# Patient Record
Sex: Female | Born: 1970 | Race: Black or African American | Hispanic: No | Marital: Single | Smoking: Current every day smoker
Health system: Southern US, Community
[De-identification: ages and names within clinical notes are randomized; demographics above are authoritative.]

## PROBLEM LIST (undated history)

## (undated) DIAGNOSIS — F319 Bipolar disorder, unspecified: Secondary | ICD-10-CM

## (undated) DIAGNOSIS — I10 Essential (primary) hypertension: Secondary | ICD-10-CM

## (undated) HISTORY — PX: OTHER SURGICAL HISTORY: SHX169

## (undated) HISTORY — PX: TUBAL LIGATION: SHX77

---

## 1993-10-30 HISTORY — PX: TUBAL LIGATION: SHX77

## 2011-02-20 ENCOUNTER — Inpatient Hospital Stay (INDEPENDENT_AMBULATORY_CARE_PROVIDER_SITE_OTHER)
Admission: RE | Admit: 2011-02-20 | Discharge: 2011-02-20 | Disposition: A | Discharge status: Home / Self Care | Payer: Self-pay | Source: Ambulatory Visit | Attending: Family Medicine | Admitting: Family Medicine

## 2011-02-20 DIAGNOSIS — J019 Acute sinusitis, unspecified: Secondary | ICD-10-CM

## 2011-02-20 DIAGNOSIS — I10 Essential (primary) hypertension: Secondary | ICD-10-CM

## 2011-09-01 ENCOUNTER — Inpatient Hospital Stay (INDEPENDENT_AMBULATORY_CARE_PROVIDER_SITE_OTHER)
Admission: RE | Admit: 2011-09-01 | Discharge: 2011-09-01 | Disposition: A | Discharge status: Home / Self Care | Payer: Self-pay | Source: Ambulatory Visit | Attending: Family Medicine | Admitting: Family Medicine

## 2011-09-01 DIAGNOSIS — I10 Essential (primary) hypertension: Secondary | ICD-10-CM

## 2011-09-01 DIAGNOSIS — N949 Unspecified condition associated with female genital organs and menstrual cycle: Secondary | ICD-10-CM

## 2011-09-01 LAB — POCT URINALYSIS DIP (DEVICE)
Glucose, UA: NEGATIVE mg/dL
Ketones, ur: 15 mg/dL — AB
Protein, ur: NEGATIVE mg/dL
Specific Gravity, Urine: 1.02 (ref 1.005–1.030)
Urobilinogen, UA: 1 mg/dL (ref 0.0–1.0)

## 2011-09-01 LAB — WET PREP, GENITAL
WBC, Wet Prep HPF POC: NONE SEEN
Yeast Wet Prep HPF POC: NONE SEEN

## 2011-09-01 LAB — POCT PREGNANCY, URINE: Preg Test, Ur: NEGATIVE

## 2011-09-02 LAB — GC/CHLAMYDIA PROBE AMP, GENITAL: GC Probe Amp, Genital: NEGATIVE

## 2011-09-07 NOTE — ED Notes (Signed)
Lab and chart reviewed. Pt. adequately treated with Flagyl for bacterial vaginosis. Barbara Ellis

## 2012-02-20 ENCOUNTER — Encounter (HOSPITAL_COMMUNITY): Payer: Self-pay

## 2012-02-20 ENCOUNTER — Emergency Department (INDEPENDENT_AMBULATORY_CARE_PROVIDER_SITE_OTHER)
Admission: EM | Admit: 2012-02-20 | Discharge: 2012-02-20 | Disposition: A | Discharge status: Home / Self Care | Payer: Self-pay | Source: Home / Self Care | Attending: Emergency Medicine | Admitting: Emergency Medicine

## 2012-02-20 DIAGNOSIS — E860 Dehydration: Secondary | ICD-10-CM

## 2012-02-20 DIAGNOSIS — H9209 Otalgia, unspecified ear: Secondary | ICD-10-CM

## 2012-02-20 DIAGNOSIS — R42 Dizziness and giddiness: Secondary | ICD-10-CM

## 2012-02-20 DIAGNOSIS — D179 Benign lipomatous neoplasm, unspecified: Secondary | ICD-10-CM

## 2012-02-20 DIAGNOSIS — W5911XA Bitten by nonvenomous snake, initial encounter: Secondary | ICD-10-CM

## 2012-02-20 DIAGNOSIS — W5901XA Bitten by nonvenomous lizards, initial encounter: Secondary | ICD-10-CM

## 2012-02-20 HISTORY — DX: Essential (primary) hypertension: I10

## 2012-02-20 LAB — POCT I-STAT, CHEM 8
Creatinine, Ser: 0.9 mg/dL (ref 0.50–1.10)
Glucose, Bld: 97 mg/dL (ref 70–99)
HCT: 49 % — ABNORMAL HIGH (ref 36.0–46.0)
Hemoglobin: 16.7 g/dL — ABNORMAL HIGH (ref 12.0–15.0)
Potassium: 3.9 mEq/L (ref 3.5–5.1)
TCO2: 24 mmol/L (ref 0–100)

## 2012-02-20 MED ORDER — MECLIZINE HCL 12.5 MG PO TABS: 25.0000 mg | ORAL_TABLET | Freq: Three times a day (TID) | ORAL | Status: AC | PRN

## 2012-02-20 NOTE — ED Notes (Signed)
C/o intermittent dizziness for 3 weeks, also reports area to rt abdomen that burns and "knot" to rt shoulder for 3 weeks. States all the sx started 3 weeks ago after snake bite to lt hand- concerned sx are related to snake bite.  No marks, breaks in skin, or discoloration noted to lt hand at site of bite. Significant other reports syncopal episode approx. 2 1/2 weeks ago.

## 2012-02-20 NOTE — Discharge Instructions (Signed)
  Today you had multiple and several concerns. #1 dizziness #2 lipoma #3 non-venomous  We discuss your exam and lab results and measures to take the next 48 hours for oral rehydration. Should also establish with primary care Dr.     Dehydration, Adult Dehydration means your body does not have as much fluid as it needs. Your kidneys, brain, and heart will not work properly without the right amount of fluids and salt.  HOME CARE  Ask your doctor how to replace body fluid losses (rehydrate).   Drink enough fluids to keep your pee (urine) clear or pale yellow.   Drink small amounts of fluids often if you feel sick to your stomach (nauseous) or throw up (vomit).   Eat like you normally do.   Avoid:   Foods or drinks high in sugar.   Bubbly (carbonated) drinks.   Juice.   Very hot or cold fluids.   Drinks with caffeine.   Fatty, greasy foods.   Alcohol.   Tobacco.   Eating too much.   Gelatin desserts.   Wash your hands to avoid spreading germs (bacteria, viruses).   Only take medicine as told by your doctor.   Keep all doctor visits as told.  GET HELP RIGHT AWAY IF:   You cannot drink something without throwing up.   You get worse even with treatment.   Your vomit has blood in it or looks greenish.   Your poop (stool) has blood in it or looks black and tarry.   You have not peed in 6 to 8 hours.   You pee a small amount of very dark pee.   You have a fever.   You pass out (faint).   You have belly (abdominal) pain that gets worse or stays in one spot (localizes).   You have a rash, stiff neck, or bad headache.   You get easily annoyed, sleepy, or are hard to wake up.   You feel weak, dizzy, or very thirsty.  MAKE SURE YOU:   Understand these instructions.   Will watch your condition.   Will get help right away if you are not doing well or get worse.  Document Released: 08/12/2009 Document Revised: 10/05/2011 Document Reviewed:  06/05/2011 [facility] Patient Information 2012 [place], [place].

## 2012-02-20 NOTE — ED Provider Notes (Signed)
History     CSN: [id number]  Arrival date & time 02/20/12  0845   First MD Initiated Contact with Patient 02/20/12 [phone]      Chief Complaint  Patient presents with  . Dizziness  . Abdominal Pain  . Shoulder Problem    (Consider location/radiation/quality/duration/timing/severity/associated sxs/prior treatment) HPI Comments: Patient presents with intermittent dizziness for the last 3 weeks she describes about 3 weeks ago she was bitten in her left hand by a snake in her hand became swollen at that time she received no medical attention at that point. (At that point denies having had any abdominal pain, numbness tingling,. Also describes about 2 weeks ago she had a episode which he thinks he passed out, this episode was unwitnessed so she is unable to recall any specific events around this event. Denies that she was experiencing any chest pains or palpitations or shortness of breath.  She has also been noticing for several months a big not on her right shoulder it is just increasing in size and occasionally feels like a numbing sensation in this area. Also describes that her abdominal area mainly in the right side sometimes feels a knot that is much smaller but similar to the one she has in her shoulder.  Today she describes it she feels somewhat often dizzy he gets worse when she walks around and had been feeling in perceiving popping noises coming out of her left ear. Patient denies any congestion, runny nose, sinus pressure or respiratory symptoms. No fevers. Other significant negative symptoms were no abdominal pain at rest, no nausea or vomiting, no urinary symptoms.  Patient is a 41 y.o. female presenting with abdominal pain. The history is provided by the patient.  Abdominal Pain The primary symptoms of the illness include abdominal pain. The primary symptoms of the illness do not include fever, fatigue, shortness of breath, nausea, vomiting, diarrhea, hematemesis, dysuria or vaginal  discharge. The onset of the illness was gradual. The problem has not changed since onset. The patient states that she believes she is currently not pregnant. The patient has not had a change in bowel habit. Symptoms associated with the illness do not include anorexia.    Past Medical History  Diagnosis Date  . Hypertension     Past Surgical History  Procedure Date  . C section   . Tubal ligation     No family history on file.  History  Substance Use Topics  . Smoking status: Current Everyday Smoker  . Smokeless tobacco: Not on file  . Alcohol Use: Yes    OB History    Grav Para Term Preterm Abortions TAB SAB Ect Mult Living                  Review of Systems  Constitutional: Negative for fever, activity change, appetite change and fatigue.  HENT: Positive for ear pain. Negative for facial swelling, rhinorrhea, neck pain, neck stiffness and tinnitus.   Respiratory: Negative for cough and shortness of breath.   Cardiovascular: Negative for chest pain, palpitations and leg swelling.  Gastrointestinal: Positive for abdominal pain. Negative for nausea, vomiting, diarrhea, anorexia and hematemesis.  Genitourinary: Negative for dysuria and vaginal discharge.  Skin: Negative for color change, rash and wound.  Neurological: Positive for dizziness and light-headedness. Negative for tremors, seizures, syncope, speech difficulty, weakness, numbness and headaches.    Allergies  Sulfa antibiotics  Home Medications   Current Outpatient Rx  Name Route Sig Dispense Refill  . MECLIZINE HCL  12.5 MG PO TABS Oral Take 2 tablets (25 mg total) by mouth 3 (three) times daily as needed for nausea. 30 tablet 0    BP 142/85  Pulse 74  Temp(Src) 98.4 F (36.9 C) (Oral)  Resp 16  SpO2 98%  LMP 02/13/2012  Physical Exam  Nursing note and vitals reviewed. Constitutional: She is oriented to person, place, and time. She appears well-developed and well-nourished. No distress.  HENT:    Right Ear: Tympanic membrane normal.  Left Ear: Tympanic membrane and ear canal normal.  Mouth/Throat: No oropharyngeal exudate.  Eyes: Conjunctivae are normal.  Neck: Neck supple.  Cardiovascular: Normal rate and regular rhythm.  Exam reveals no gallop and no friction rub.   No murmur heard. Pulmonary/Chest: Effort normal and breath sounds normal.  Neurological: She is alert and oriented to person, place, and time. No cranial nerve deficit.  Skin: No rash noted. No erythema. No pallor.       ED Course  Procedures (including critical care time)  Labs Reviewed  POCT I-STAT, CHEM 8 - Abnormal; Notable for the following:    Hemoglobin 16.7 (*)    HCT 49.0 (*)    All other components within normal limits   No results found.   1. Dehydration   2. Dizziness   3. Otalgia   4. Bite, snake, non-venomous   5. Lipoma       MDM   Patient multisymptom visit, not limited to unknown (type) snakebite 3 weeks ago, intermittent dizziness, right shoulder lipoma, intermittent right-sided abdominal discomfort. Presyncopal and syncopal episodes. Patient has been expressing some of his symptoms for weeks did not receive any medical attention. Today patient describes she's still dizzy and having intermittent left ear discomforts. Vital signs repeated, physical exam and labs were as discussed normal. Patient was instructed for several things among them breath relaxer symptoms that would want further evaluation in the emergency department. Patient understood instructions and agreed with plan of care and followup care as necessary. Waiting for Medicaid to activate to establish a primary care Dr. She suspect she has high blood pressure. Have encouraged patient to rehydrate herself she had mild elevation of her H&H. As patient was having intermittent left-sided ear discomfort along with some vertigo sensations was instructed to avoid driving and to take meclizine for the next 72 hours.       Jimmie [name], MD 02/20/12 1140

## 2012-07-13 ENCOUNTER — Encounter (HOSPITAL_COMMUNITY): Payer: Self-pay | Admitting: Emergency Medicine

## 2012-07-13 ENCOUNTER — Emergency Department (HOSPITAL_COMMUNITY)
Admission: EM | Admit: 2012-07-13 | Discharge: 2012-07-14 | Disposition: A | Discharge status: Home / Self Care | Payer: Self-pay | Attending: Emergency Medicine | Admitting: Emergency Medicine

## 2012-07-13 DIAGNOSIS — I10 Essential (primary) hypertension: Secondary | ICD-10-CM | POA: Insufficient documentation

## 2012-07-13 DIAGNOSIS — F319 Bipolar disorder, unspecified: Secondary | ICD-10-CM | POA: Insufficient documentation

## 2012-07-13 DIAGNOSIS — F32A Depression, unspecified: Secondary | ICD-10-CM

## 2012-07-13 DIAGNOSIS — F17200 Nicotine dependence, unspecified, uncomplicated: Secondary | ICD-10-CM | POA: Insufficient documentation

## 2012-07-13 DIAGNOSIS — F329 Major depressive disorder, single episode, unspecified: Secondary | ICD-10-CM

## 2012-07-13 HISTORY — DX: Bipolar disorder, unspecified: F31.9

## 2012-07-13 NOTE — ED Notes (Signed)
Pt placed in scrubs. 

## 2012-07-13 NOTE — ED Notes (Signed)
Pt sent from [facility] for med clearance, can return if clear. Pt +SI, +HI, pt is IVC

## 2012-07-14 LAB — COMPREHENSIVE METABOLIC PANEL
AST: 12 U/L (ref 0–37)
Albumin: 3.8 g/dL (ref 3.5–5.2)
Alkaline Phosphatase: 53 U/L (ref 39–117)
BUN: 9 mg/dL (ref 6–23)
Chloride: 103 mEq/L (ref 96–112)
Potassium: 3 mEq/L — ABNORMAL LOW (ref 3.5–5.1)
Sodium: 137 mEq/L (ref 135–145)
Total Bilirubin: 0.8 mg/dL (ref 0.3–1.2)
Total Protein: 6.7 g/dL (ref 6.0–8.3)

## 2012-07-14 LAB — RAPID URINE DRUG SCREEN, HOSP PERFORMED
Amphetamines: NOT DETECTED
Barbiturates: NOT DETECTED
Opiates: NOT DETECTED
Tetrahydrocannabinol: POSITIVE — AB

## 2012-07-14 LAB — CBC WITH DIFFERENTIAL/PLATELET
Basophils Absolute: 0 10*3/uL (ref 0.0–0.1)
Basophils Relative: 0 % (ref 0–1)
Eosinophils Absolute: 0.3 10*3/uL (ref 0.0–0.7)
Hemoglobin: 15.4 g/dL — ABNORMAL HIGH (ref 12.0–15.0)
MCH: 33 pg (ref 26.0–34.0)
MCHC: 35.5 g/dL (ref 30.0–36.0)
Neutro Abs: 7.1 10*3/uL (ref 1.7–7.7)
Neutrophils Relative %: 60 % (ref 43–77)
Platelets: 285 10*3/uL (ref 150–400)
RDW: 13 % (ref 11.5–15.5)

## 2012-07-14 MED ORDER — IBUPROFEN 200 MG PO TABS
600.0000 mg | ORAL_TABLET | Freq: Once | ORAL | Status: AC
  Administered 2012-07-14: 600 mg via ORAL
  Filled 2012-07-14: qty 3

## 2012-07-14 MED ORDER — POTASSIUM CHLORIDE 20 MEQ/15ML (10%) PO LIQD
40.0000 meq | Freq: Once | ORAL | Status: AC
  Administered 2012-07-14: 40 meq via ORAL
  Filled 2012-07-14: qty 30

## 2012-07-14 MED ORDER — LISINOPRIL 5 MG PO TABS
5.0000 mg | ORAL_TABLET | Freq: Once | ORAL | Status: AC
  Administered 2012-07-14: 5 mg via ORAL
  Filled 2012-07-14: qty 1

## 2012-07-14 MED ORDER — LISINOPRIL 20 MG PO TABS: 5.0000 mg | ORAL_TABLET | Freq: Every day | ORAL | Status: DC

## 2012-07-14 NOTE — ED Provider Notes (Signed)
History     CSN: [id number]  Arrival date & time 07/13/12  2313   First MD Initiated Contact with Patient 07/14/12 0041      Chief Complaint  Patient presents with  . Medical Clearance    (Consider location/radiation/quality/duration/timing/severity/associated sxs/prior treatment) The history is provided by the patient. No language interpreter was used.  41yo female coming from [facility] for medical clearance and for high blood pressure.  She has been at the facility x 2 days for depression and SI.  No c/o h/a, weakness, or any stroke symptoms.  We will medically clear her and send her back.  Officer at bedside.  Past Medical History  Diagnosis Date  . Hypertension   . Bipolar 1 disorder     Past Surgical History  Procedure Date  . C section   . Tubal ligation     No family history on file.  History  Substance Use Topics  . Smoking status: Current Every Day Smoker  . Smokeless tobacco: Not on file  . Alcohol Use: No    OB History    Grav Para Term Preterm Abortions TAB SAB Ect Mult Living                  Review of Systems  Constitutional: Negative.   HENT: Negative.   Eyes: Negative.   Respiratory: Negative.   Cardiovascular: Negative.   Gastrointestinal: Negative.   Musculoskeletal: Negative.   Skin: Negative.   Neurological: Negative.   Psychiatric/Behavioral: Positive for suicidal ideas. Negative for hallucinations, confusion, disturbed wake/sleep cycle and dysphoric mood. The patient is not nervous/anxious.   All other systems reviewed and are negative.    Allergies  Sulfa antibiotics  Home Medications   Current Outpatient Rx  Name Route Sig Dispense Refill  . ARIPIPRAZOLE 5 MG PO TABS Oral Take 2.5 mg by mouth daily.    . TRAZODONE HCL 50 MG PO TABS Oral Take 50 mg by mouth at bedtime.      BP 164/99  Pulse 84  Temp 98 F (36.7 C) (Oral)  Resp 16  Wt 210 lb (95.255 kg)  SpO2 100%  LMP 07/12/2012  Physical Exam  Nursing note and  vitals reviewed. Constitutional: She is oriented to person, place, and time. She appears well-developed and well-nourished.  HENT:  Head: Normocephalic and atraumatic.  Eyes: Conjunctivae normal and EOM are normal. Pupils are equal, round, and reactive to light.  Neck: Normal range of motion. Neck supple.  Cardiovascular: Normal rate.   Pulmonary/Chest: Effort normal.  Abdominal: Soft.  Musculoskeletal: Normal range of motion. She exhibits no edema and no tenderness.  Neurological: She is alert and oriented to person, place, and time. She has normal reflexes.  Skin: Skin is warm and dry.  Psychiatric: Her speech is normal and behavior is normal. Judgment and thought content normal. Her mood appears not anxious. Her affect is not inappropriate. Cognition and memory are normal. She exhibits a depressed mood.       Pleasant and cooperative.  States she had SI for the last 2 days but better today.    ED Course  Procedures (including critical care time)  Labs Reviewed  CBC WITH DIFFERENTIAL - Abnormal; Notable for the following:    WBC 11.9 (*)     Hemoglobin 15.4 (*)     All other components within normal limits  URINE RAPID DRUG SCREEN (HOSP PERFORMED) - Abnormal; Notable for the following:    Tetrahydrocannabinol POSITIVE (*)     All  other components within normal limits  COMPREHENSIVE METABOLIC PANEL  ETHANOL   No results found.   1. Depression       MDM  41yo female here from [facility]  medically cleared and rx for lisinopril. SI and depression. Return to [facility] with Emergency planning/management officer.  Ready for discharge. Rx for lisinopril.   Kcl for potassium 3.0.    Labs Reviewed  CBC WITH DIFFERENTIAL - Abnormal; Notable for the following:    WBC 11.9 (*)     Hemoglobin 15.4 (*)     All other components within normal limits  COMPREHENSIVE METABOLIC PANEL - Abnormal; Notable for the following:    Potassium 3.0 (*)     All other components within normal limits  URINE RAPID DRUG  SCREEN (HOSP PERFORMED) - Abnormal; Notable for the following:    Tetrahydrocannabinol POSITIVE (*)     All other components within normal limits  ETHANOL  LAB REPORT - SCANNED           Remi Haggard, NP 07/15/12 1653

## 2012-07-14 NOTE — ED Notes (Signed)
Results faxed to [facility] 

## 2012-07-16 NOTE — ED Provider Notes (Signed)
Medical screening examination/treatment/procedure(s) were performed by non-physician practitioner and as supervising physician I was immediately available for consultation/collaboration.   Akai Dollard M Nesreen Albano, MD 07/16/12 0747 

## 2013-11-21 ENCOUNTER — Other Ambulatory Visit (HOSPITAL_COMMUNITY)
Admission: RE | Admit: 2013-11-21 | Discharge: 2013-11-21 | Disposition: A | Discharge status: Home / Self Care | Payer: BC Managed Care – PPO | Source: Ambulatory Visit | Attending: Family Medicine | Admitting: Family Medicine

## 2013-11-21 ENCOUNTER — Emergency Department (HOSPITAL_COMMUNITY)
Admission: EM | Admit: 2013-11-21 | Discharge: 2013-11-21 | Disposition: A | Discharge status: Home / Self Care | Payer: BC Managed Care – PPO | Source: Home / Self Care

## 2013-11-21 ENCOUNTER — Encounter (HOSPITAL_COMMUNITY): Payer: Self-pay | Admitting: Emergency Medicine

## 2013-11-21 DIAGNOSIS — N760 Acute vaginitis: Secondary | ICD-10-CM | POA: Insufficient documentation

## 2013-11-21 DIAGNOSIS — A084 Viral intestinal infection, unspecified: Secondary | ICD-10-CM

## 2013-11-21 DIAGNOSIS — Z113 Encounter for screening for infections with a predominantly sexual mode of transmission: Secondary | ICD-10-CM | POA: Insufficient documentation

## 2013-11-21 DIAGNOSIS — R109 Unspecified abdominal pain: Secondary | ICD-10-CM

## 2013-11-21 DIAGNOSIS — N898 Other specified noninflammatory disorders of vagina: Secondary | ICD-10-CM

## 2013-11-21 DIAGNOSIS — N72 Inflammatory disease of cervix uteri: Secondary | ICD-10-CM

## 2013-11-21 DIAGNOSIS — K219 Gastro-esophageal reflux disease without esophagitis: Secondary | ICD-10-CM

## 2013-11-21 DIAGNOSIS — A088 Other specified intestinal infections: Secondary | ICD-10-CM

## 2013-11-21 LAB — POCT URINALYSIS DIP (DEVICE)
Bilirubin Urine: NEGATIVE
Glucose, UA: NEGATIVE mg/dL
KETONES UR: NEGATIVE mg/dL
Leukocytes, UA: NEGATIVE
Nitrite: NEGATIVE
PROTEIN: NEGATIVE mg/dL
SPECIFIC GRAVITY, URINE: 1.02 (ref 1.005–1.030)
Urobilinogen, UA: 0.2 mg/dL (ref 0.0–1.0)
pH: 5.5 (ref 5.0–8.0)

## 2013-11-21 LAB — POCT PREGNANCY, URINE: PREG TEST UR: NEGATIVE

## 2013-11-21 MED ORDER — OMEPRAZOLE 20 MG PO CPDR: 20.0000 mg | DELAYED_RELEASE_CAPSULE | Freq: Two times a day (BID) | ORAL | Status: DC

## 2013-11-21 MED ORDER — METRONIDAZOLE 500 MG PO TABS: 500.0000 mg | ORAL_TABLET | Freq: Two times a day (BID) | ORAL | Status: DC

## 2013-11-21 MED ORDER — HYOSCYAMINE SULFATE 0.125 MG SL SUBL: 0.1250 mg | SUBLINGUAL_TABLET | SUBLINGUAL | Status: DC | PRN

## 2013-11-21 MED ORDER — ONDANSETRON HCL 4 MG PO TABS: 4.0000 mg | ORAL_TABLET | Freq: Four times a day (QID) | ORAL | Status: DC

## 2013-11-21 NOTE — ED Notes (Signed)
C/o 2 week duration of abdominal pain, worsening past 2-3 days. Concern for poss UTI, thicker than usual vaginal secretions LMP onset last PM, 2 weeks early , no BC. Pain lower abdomin w radiation from RLQ onto back

## 2013-11-21 NOTE — ED Provider Notes (Signed)
CSN: [id number]     Arrival date & time 11/21/13  1200 History   First MD Initiated Contact with Patient 11/21/13 1233     Chief Complaint  Patient presents with  . Abdominal Pain   (Consider location/radiation/quality/duration/timing/severity/associated sxs/prior Treatment) HPI Comments: 43-year-old female complains that 2-3 weeks of lower abdominal pain and cramping which was worse at onset, and improved for a few days and then worse in the past 3 days with nausea and dizziness. She also has frequent urination. States she has a thick or vaginal discharge has some sort of discharge most of the time. She has vomiting and diarrhea 2 weeks ago and now nausea without vomiting. She has diarrhea 3 and 4 days ago but none in the past 2 days. No fever.   Past Medical History  Diagnosis Date  . Hypertension   . Bipolar 1 disorder    Past Surgical History  Procedure Laterality Date  . C section    . Tubal ligation     History reviewed. No pertinent family history. History  Substance Use Topics  . Smoking status: Current Every Day Smoker  . Smokeless tobacco: Not on file  . Alcohol Use: No   OB History   Grav Para Term Preterm Abortions TAB SAB Ect Mult Living                 Review of Systems  Constitutional: Positive for activity change. Negative for fever.  HENT: Negative.   Respiratory: Negative.   Cardiovascular: Negative.   Gastrointestinal: Positive for nausea, abdominal pain and diarrhea. Negative for constipation and blood in stool.  Genitourinary: Positive for frequency, vaginal bleeding and vaginal discharge.  Skin: Negative.   Neurological: Negative for dizziness and syncope.  Psychiatric/Behavioral: Negative.     Allergies  Sulfa antibiotics  Home Medications   Current Outpatient Rx  Name  Route  Sig  Dispense  Refill  . ARIPiprazole (ABILIFY) 5 MG tablet   Oral   Take 2.5 mg by mouth daily.         . hyoscyamine (LEVSIN/SL) 0.125 MG SL tablet    Sublingual   Place 1 tablet (0.125 mg total) under the tongue every 4 (four) hours as needed. For abdominal cramps   20 tablet   0   . EXPIRED: lisinopril (PRINIVIL,ZESTRIL) 20 MG tablet   Oral   Take 0.5 tablets (10 mg total) by mouth daily.   30 tablet   0   . metroNIDAZOLE (FLAGYL) 500 MG tablet   Oral   Take 1 tablet (500 mg total) by mouth 2 (two) times daily. X 7 days   14 tablet   0   . omeprazole (PRILOSEC) 20 MG capsule   Oral   Take 1 capsule (20 mg total) by mouth 2 (two) times daily. X 10 days   20 capsule   0   . ondansetron (ZOFRAN) 4 MG tablet   Oral   Take 1 tablet (4 mg total) by mouth every 6 (six) hours.   12 tablet   0   . traZODone (DESYREL) 50 MG tablet   Oral   Take 50 mg by mouth at bedtime.          BP 142/83  Pulse 87  Temp(Src) 98 F (36.7 C) (Oral)  Resp 16  SpO2 100% Physical Exam  Nursing note and vitals reviewed. Constitutional: She is oriented to person, place, and time. She appears well-developed and well-nourished. No distress.  Obese  Neck: Normal range  of motion. Neck supple.  Cardiovascular: Normal rate, regular rhythm and normal heart sounds.   Pulmonary/Chest: Effort normal and breath sounds normal. No respiratory distress. She has no wheezes. She has no rales.  Abdominal: Soft. Bowel sounds are normal. She exhibits no distension and no mass. There is no rebound and no guarding.  Minor tenderness in the epigastrium. Otherwise the abdomen is soft and nontender to deep palpation.  Genitourinary:  Normal external female genitalia. Redundant vaginal walls and difficult to capture the cervix. There is moderate amount of bleeding and she started her menses yesterday. The ectocervix is pink and without lesions. No specific discharge is seen. Bimanual reveals positive CMT and left adnexal tenderness.  Musculoskeletal: She exhibits no edema.  Neurological: She is alert and oriented to person, place, and time. She exhibits normal  muscle tone.  Skin: Skin is warm and dry.  Psychiatric: She has a normal mood and affect.    ED Course  Procedures (including critical care time) Labs Review Labs Reviewed  POCT URINALYSIS DIP (DEVICE) - Abnormal; Notable for the following:    Hgb urine dipstick MODERATE (*)    All other components within normal limits  POCT PREGNANCY, URINE  CERVICOVAGINAL ANCILLARY ONLY   Imaging Review No results found. Results for orders placed during the hospital encounter of 11/21/13  POCT URINALYSIS DIP (DEVICE)      Result Value Range   Glucose, UA NEGATIVE  NEGATIVE mg/dL   Bilirubin Urine NEGATIVE  NEGATIVE   Ketones, ur NEGATIVE  NEGATIVE mg/dL   Specific Gravity, Urine 1.020  1.005 - 1.030   Hgb urine dipstick MODERATE (*) NEGATIVE   pH 5.5  5.0 - 8.0   Protein, ur NEGATIVE  NEGATIVE mg/dL   Urobilinogen, UA 0.2  0.0 - 1.0 mg/dL   Nitrite NEGATIVE  NEGATIVE   Leukocytes, UA NEGATIVE  NEGATIVE  POCT PREGNANCY, URINE      Result Value Range   Preg Test, Ur NEGATIVE  NEGATIVE      MDM   1. Abdominal pain in female patient   2. Viral gastroenteritis   3. Vaginal discharge   4. Cervicitis   5. GERD (gastroesophageal reflux disease)      Flagyl 500 bid Omeprazole 20 mg BID Levsin for cramping. I suspect her cramping pain is due to gas and residual viral gastroenteritis     Janne Napoleon, NP 11/21/13 1338

## 2013-11-21 NOTE — Discharge Instructions (Signed)
Abdominal Pain, Women °Abdominal (stomach, pelvic, or belly) pain can be caused by many things. It is important to tell your doctor: °· The location of the pain. °· Does it come and go or is it present all the time? °· Are there things that start the pain (eating certain foods, exercise)? °· Are there other symptoms associated with the pain (fever, nausea, vomiting, diarrhea)? °All of this is helpful to know when trying to find the cause of the pain. °CAUSES  °· Stomach: virus or bacteria infection, or ulcer. °· Intestine: appendicitis (inflamed appendix), regional ileitis (Crohn's disease), ulcerative colitis (inflamed colon), irritable bowel syndrome, diverticulitis (inflamed diverticulum of the colon), or cancer of the stomach or intestine. °· Gallbladder disease or stones in the gallbladder. °· Kidney disease, kidney stones, or infection. °· Pancreas infection or cancer. °· Fibromyalgia (pain disorder). °· Diseases of the female organs: °· Uterus: fibroid (non-cancerous) tumors or infection. °· Fallopian tubes: infection or tubal pregnancy. °· Ovary: cysts or tumors. °· Pelvic adhesions (scar tissue). °· Endometriosis (uterus lining tissue growing in the pelvis and on the pelvic organs). °· Pelvic congestion syndrome (female organs filling up with blood just before the menstrual period). °· Pain with the menstrual period. °· Pain with ovulation (producing an egg). °· Pain with an IUD (intrauterine device, birth control) in the uterus. °· Cancer of the female organs. °· Functional pain (pain not caused by a disease, may improve without treatment). °· Psychological pain. °· Depression. °DIAGNOSIS  °Your doctor will decide the seriousness of your pain by doing an examination. °· Blood tests. °· X-rays. °· Ultrasound. °· CT scan (computed tomography, special type of X-ray). °· MRI (magnetic resonance imaging). °· Cultures, for infection. °· Barium enema (dye inserted in the large intestine, to better view it with  X-rays). °· Colonoscopy (looking in intestine with a lighted tube). °· Laparoscopy (minor surgery, looking in abdomen with a lighted tube). °· Major abdominal exploratory surgery (looking in abdomen with a large incision). °TREATMENT  °The treatment will depend on the cause of the pain.  °· Many cases can be observed and treated at home. °· Over-the-counter medicines recommended by your caregiver. °· Prescription medicine. °· Antibiotics, for infection. °· Birth control pills, for painful periods or for ovulation pain. °· Hormone treatment, for endometriosis. °· Nerve blocking injections. °· Physical therapy. °· Antidepressants. °· Counseling with a psychologist or psychiatrist. °· Minor or major surgery. °HOME CARE INSTRUCTIONS  °· Do not take laxatives, unless directed by your caregiver. °· Take over-the-counter pain medicine only if ordered by your caregiver. Do not take aspirin because it can cause an upset stomach or bleeding. °· Try a clear liquid diet (broth or water) as ordered by your caregiver. Slowly move to a bland diet, as tolerated, if the pain is related to the stomach or intestine. °· Have a thermometer and take your temperature several times a day, and record it. °· Bed rest and sleep, if it helps the pain. °· Avoid sexual intercourse, if it causes pain. °· Avoid stressful situations. °· Keep your follow-up appointments and tests, as your caregiver orders. °· If the pain does not go away with medicine or surgery, you may try: °· Acupuncture. °· Relaxation exercises (yoga, meditation). °· Group therapy. °· Counseling. °SEEK MEDICAL CARE IF:  °· You notice certain foods cause stomach pain. °· Your home care treatment is not helping your pain. °· You need stronger pain medicine. °· You want your IUD removed. °· You feel faint or   lightheaded.  You develop nausea and vomiting.  You develop a rash.  You are having side effects or an allergy to your medicine. SEEK IMMEDIATE MEDICAL CARE IF:   Your  pain does not go away or gets worse.  You have a fever.  Your pain is felt only in portions of the abdomen. The right side could possibly be appendicitis. The left lower portion of the abdomen could be colitis or diverticulitis.  You are passing blood in your stools (bright red or black tarry stools, with or without vomiting).  You have blood in your urine.  You develop chills, with or without a fever.  You pass out. MAKE SURE YOU:   Understand these instructions.  Will watch your condition.  Will get help right away if you are not doing well or get worse. Document Released: 08/13/2007 Document Revised: 01/08/2012 Document Reviewed: 09/02/2009 [facility] Patient Information 2014 [place], [place].  Cervicitis Cervicitis is a soreness and swelling (inflammation) of the cervix. Your cervix is located at the bottom of your uterus. It opens up to the vagina. CAUSES   Sexually transmitted infections (STIs).   Allergic reaction.   Medicines or birth control devices that are put in the vagina.   Injury to the cervix.   Bacterial infections.  RISK FACTORS You are at greater risk if you:  Have unprotected sexual intercourse.  Have sexual intercourse with many partners.  Began sexual intercourse at an early age.  Have a history of STIs. SYMPTOMS  There may be no symptoms. If symptoms occur, they may include:   Grey, white, yellow, or bad-smelling vaginal discharge.   Pain or itching of the area outside the vagina.   Painful sexual intercourse.   Lower abdominal or lower back pain, especially during intercourse.   Frequent urination.   Abnormal vaginal bleeding between periods, after sexual intercourse, or after menopause.   Pressure or a heavy feeling in the pelvis.  DIAGNOSIS  Diagnosis is made after a pelvic exam. Other tests may include:   Examination of any discharge under a microscope (wet prep).   A Pap test.  TREATMENT  Treatment will depend  on the cause of cervicitis. If it is caused by an STI, both you and your partner will need to be treated. Antibiotic medicines will be given.  HOME CARE INSTRUCTIONS   Do not have sexual intercourse until your health care provider says it is okay.   Do not have sexual intercourse until your partner has been treated, if your cervicitis is caused by an STI.   Take your antibiotics as directed. Finish them even if you start to feel better.  SEEK MEDICAL CARE IF:  Your symptoms come back.   You have a fever.  MAKE SURE YOU:   Understand these instructions.  Will watch your condition.  Will get help right away if you are not doing well or get worse. Document Released: 10/16/2005 Document Revised: 06/18/2013 Document Reviewed: 04/09/2013 [facility] Patient Information 2014 [place].  Diet for Gastroesophageal Reflux Disease, Adult Reflux (acid reflux) is when acid from your stomach flows up into the esophagus. When acid comes in contact with the esophagus, the acid causes irritation and soreness (inflammation) in the esophagus. When reflux happens often or so severely that it causes damage to the esophagus, it is called gastroesophageal reflux disease (GERD). Nutrition therapy can help ease the discomfort of GERD. FOODS OR DRINKS TO AVOID OR LIMIT  Smoking or chewing tobacco. Nicotine is one of the most potent  stimulants to acid production in the gastrointestinal tract.  Caffeinated and decaffeinated coffee and black tea.  Regular or low-calorie carbonated beverages or energy drinks (caffeine-free carbonated beverages are allowed).   Strong spices, such as black pepper, white pepper, red pepper, cayenne, curry powder, and chili powder.  Peppermint or spearmint.  Chocolate.  High-fat foods, including meats and fried foods. Extra added fats including oils, butter, salad dressings, and nuts. Limit these to less than 8 tsp per day.  Fruits and vegetables if they are not  tolerated, such as citrus fruits or tomatoes.  Alcohol.  Any food that seems to aggravate your condition. If you have questions regarding your diet, call your caregiver or a registered dietitian. OTHER THINGS THAT MAY HELP GERD INCLUDE:   Eating your meals slowly, in a relaxed setting.  Eating 5 to 6 small meals per day instead of 3 large meals.  Eliminating food for a period of time if it causes distress.  Not lying down until 3 hours after eating a meal.  Keeping the head of your bed raised 6 to 9 inches (15 to 23 cm) by using a foam wedge or blocks under the legs of the bed. Lying flat may make symptoms worse.  Being physically active. Weight loss may be helpful in reducing reflux in overweight or obese adults.  Wear loose fitting clothing EXAMPLE MEAL PLAN This meal plan is approximately 2,000 calories based on [email or web address] meal planning guidelines. Breakfast   cup cooked oatmeal.  1 cup strawberries.  1 cup low-fat milk.  1 oz almonds. Snack  1 cup cucumber slices.  6 oz yogurt (made from low-fat or fat-free milk). Lunch  2 slice whole-wheat bread.  2 oz sliced [place].  2 tsp mayonnaise.  1 cup blueberries.  1 cup snap peas. Snack  6 whole-wheat crackers.  1 oz string cheese. Dinner   cup brown rice.  1 cup mixed veggies.  1 tsp olive oil.  3 oz grilled fish. Document Released: 10/16/2005 Document Revised: 01/08/2012 Document Reviewed: 09/01/2011 [facility] Patient Information 2014 [place], [place].  Gastroesophageal Reflux Disease, Adult Gastroesophageal reflux disease (GERD) happens when acid from your stomach flows up into the esophagus. When acid comes in contact with the esophagus, the acid causes soreness (inflammation) in the esophagus. Over time, GERD may create small holes (ulcers) in the lining of the esophagus. CAUSES   Increased body weight. This puts pressure on the stomach, making acid rise from the stomach into the  esophagus.  Smoking. This increases acid production in the stomach.  Drinking alcohol. This causes decreased pressure in the lower esophageal sphincter (valve or ring of muscle between the esophagus and stomach), allowing acid from the stomach into the esophagus.  Late evening meals and a full stomach. This increases pressure and acid production in the stomach.  A malformed lower esophageal sphincter. Sometimes, no cause is found. SYMPTOMS   Burning pain in the lower part of the mid-chest behind the breastbone and in the mid-stomach area. This may occur twice a week or more often.  Trouble swallowing.  Sore throat.  Dry cough.  Asthma-like symptoms including chest tightness, shortness of breath, or wheezing. DIAGNOSIS  Your caregiver may be able to diagnose GERD based on your symptoms. In some cases, X-rays and other tests may be done to check for complications or to check the condition of your stomach and esophagus. TREATMENT  Your caregiver may recommend over-the-counter or prescription medicines to help decrease acid production. Ask your caregiver before starting  or adding any new medicines.  HOME CARE INSTRUCTIONS   Change the factors that you can control. Ask your caregiver for guidance concerning weight loss, quitting smoking, and alcohol consumption.  Avoid foods and drinks that make your symptoms worse, such as:  Caffeine or alcoholic drinks.  Chocolate.  Peppermint or mint flavorings.  Garlic and onions.  Spicy foods.  Citrus fruits, such as oranges, lemons, or limes.  Tomato-based foods such as sauce, chili, salsa, and pizza.  Fried and fatty foods.  Avoid lying down for the 3 hours prior to your bedtime or prior to taking a nap.  Eat small, frequent meals instead of large meals.  Wear loose-fitting clothing. Do not wear anything tight around your waist that causes pressure on your stomach.  Raise the head of your bed 6 to 8 inches with wood blocks to  help you sleep. Extra pillows will not help.  Only take over-the-counter or prescription medicines for pain, discomfort, or fever as directed by your caregiver.  Do not take aspirin, ibuprofen, or other nonsteroidal anti-inflammatory drugs (NSAIDs). SEEK IMMEDIATE MEDICAL CARE IF:   You have pain in your arms, neck, jaw, teeth, or back.  Your pain increases or changes in intensity or duration.  You develop nausea, vomiting, or sweating (diaphoresis).  You develop shortness of breath, or you faint.  Your vomit is green, yellow, black, or looks like coffee grounds or blood.  Your stool is red, bloody, or black. These symptoms could be signs of other problems, such as heart disease, gastric bleeding, or esophageal bleeding. MAKE SURE YOU:   Understand these instructions.  Will watch your condition.  Will get help right away if you are not doing well or get worse. Document Released: 07/26/2005 Document Revised: 01/08/2012 Document Reviewed: 05/05/2011 [facility] Patient Information 2014 [place], [place].  Viral Gastroenteritis Viral gastroenteritis is also known as stomach flu. This condition affects the stomach and intestinal tract. It can cause sudden diarrhea and vomiting. The illness typically lasts 3 to 8 days. Most people develop an immune response that eventually gets rid of the virus. While this natural response develops, the virus can make you quite ill. CAUSES  Many different viruses can cause gastroenteritis, such as rotavirus or noroviruses. You can catch one of these viruses by consuming contaminated food or water. You may also catch a virus by sharing utensils or other personal items with an infected person or by touching a contaminated surface. SYMPTOMS  The most common symptoms are diarrhea and vomiting. These problems can cause a severe loss of body fluids (dehydration) and a body salt (electrolyte) imbalance. Other symptoms may  include:  Fever.  Headache.  Fatigue.  Abdominal pain. DIAGNOSIS  Your caregiver can usually diagnose viral gastroenteritis based on your symptoms and a physical exam. A stool sample may also be taken to test for the presence of viruses or other infections. TREATMENT  This illness typically goes away on its own. Treatments are aimed at rehydration. The most serious cases of viral gastroenteritis involve vomiting so severely that you are not able to keep fluids down. In these cases, fluids must be given through an intravenous line (IV). HOME CARE INSTRUCTIONS   Drink enough fluids to keep your urine clear or pale yellow. Drink small amounts of fluids frequently and increase the amounts as tolerated.  Ask your caregiver for specific rehydration instructions.  Avoid:  Foods high in sugar.  Alcohol.  Carbonated drinks.  Tobacco.  Juice.  Caffeine drinks.  Extremely hot or  cold fluids.  Fatty, greasy foods.  Too much intake of anything at one time.  Dairy products until 24 to 48 hours after diarrhea stops.  You may consume probiotics. Probiotics are active cultures of beneficial bacteria. They may lessen the amount and number of diarrheal stools in adults. Probiotics can be found in yogurt with active cultures and in supplements.  Wash your hands well to avoid spreading the virus.  Only take over-the-counter or prescription medicines for pain, discomfort, or fever as directed by your caregiver. Do not give aspirin to children. Antidiarrheal medicines are not recommended.  Ask your caregiver if you should continue to take your regular prescribed and over-the-counter medicines.  Keep all follow-up appointments as directed by your caregiver. SEEK IMMEDIATE MEDICAL CARE IF:   You are unable to keep fluids down.  You do not urinate at least once every 6 to 8 hours.  You develop shortness of breath.  You notice blood in your stool or vomit. This may look like coffee  grounds.  You have abdominal pain that increases or is concentrated in one small area (localized).  You have persistent vomiting or diarrhea.  You have a fever.  The patient is a child younger than 3 months, and he or she has a fever.  The patient is a child older than 3 months, and he or she has a fever and persistent symptoms.  The patient is a child older than 3 months, and he or she has a fever and symptoms suddenly get worse.  The patient is a baby, and he or she has no tears when crying. MAKE SURE YOU:   Understand these instructions.  Will watch your condition.  Will get help right away if you are not doing well or get worse. Document Released: 10/16/2005 Document Revised: 01/08/2012 Document Reviewed: 08/02/2011 [facility] Patient Information 2014 [place].

## 2013-11-21 NOTE — ED Provider Notes (Signed)
Medical screening examination/treatment/procedure(s) were performed by resident physician or non-physician practitioner and as supervising physician I was immediately available for consultation/collaboration.   Kita Neace DOUGLAS MD.   Ahria Slappey D Nanci Lakatos, MD 11/21/13 1440 

## 2014-02-11 ENCOUNTER — Emergency Department (HOSPITAL_COMMUNITY)
Admission: EM | Admit: 2014-02-11 | Discharge: 2014-02-11 | Disposition: A | Discharge status: Home / Self Care | Payer: BC Managed Care – PPO | Attending: Emergency Medicine | Admitting: Emergency Medicine

## 2014-02-11 ENCOUNTER — Emergency Department (HOSPITAL_COMMUNITY): Payer: BC Managed Care – PPO

## 2014-02-11 ENCOUNTER — Encounter (HOSPITAL_COMMUNITY): Payer: Self-pay | Admitting: Emergency Medicine

## 2014-02-11 DIAGNOSIS — F17200 Nicotine dependence, unspecified, uncomplicated: Secondary | ICD-10-CM | POA: Insufficient documentation

## 2014-02-11 DIAGNOSIS — Z79899 Other long term (current) drug therapy: Secondary | ICD-10-CM | POA: Insufficient documentation

## 2014-02-11 DIAGNOSIS — I10 Essential (primary) hypertension: Secondary | ICD-10-CM | POA: Insufficient documentation

## 2014-02-11 DIAGNOSIS — Z3202 Encounter for pregnancy test, result negative: Secondary | ICD-10-CM | POA: Insufficient documentation

## 2014-02-11 DIAGNOSIS — F319 Bipolar disorder, unspecified: Secondary | ICD-10-CM | POA: Insufficient documentation

## 2014-02-11 DIAGNOSIS — N12 Tubulo-interstitial nephritis, not specified as acute or chronic: Secondary | ICD-10-CM | POA: Insufficient documentation

## 2014-02-11 LAB — CBC WITH DIFFERENTIAL/PLATELET
BASOS ABS: 0 10*3/uL (ref 0.0–0.1)
BASOS PCT: 0 % (ref 0–1)
EOS ABS: 0.2 10*3/uL (ref 0.0–0.7)
Eosinophils Relative: 1 % (ref 0–5)
HEMATOCRIT: 41.2 % (ref 36.0–46.0)
Hemoglobin: 14.6 g/dL (ref 12.0–15.0)
Lymphocytes Relative: 19 % (ref 12–46)
Lymphs Abs: 3.5 10*3/uL (ref 0.7–4.0)
MCH: 33 pg (ref 26.0–34.0)
MCHC: 35.4 g/dL (ref 30.0–36.0)
MCV: 93.2 fL (ref 78.0–100.0)
MONO ABS: 0.9 10*3/uL (ref 0.1–1.0)
MONOS PCT: 5 % (ref 3–12)
Neutro Abs: 14.1 10*3/uL — ABNORMAL HIGH (ref 1.7–7.7)
Neutrophils Relative %: 75 % (ref 43–77)
Platelets: 305 10*3/uL (ref 150–400)
RBC: 4.42 MIL/uL (ref 3.87–5.11)
RDW: 13.1 % (ref 11.5–15.5)
WBC: 18.7 10*3/uL — ABNORMAL HIGH (ref 4.0–10.5)

## 2014-02-11 LAB — COMPREHENSIVE METABOLIC PANEL
ALT: 14 U/L (ref 0–35)
AST: 14 U/L (ref 0–37)
Albumin: 3.5 g/dL (ref 3.5–5.2)
Alkaline Phosphatase: 60 U/L (ref 39–117)
BILIRUBIN TOTAL: 0.3 mg/dL (ref 0.3–1.2)
BUN: 14 mg/dL (ref 6–23)
CALCIUM: 9 mg/dL (ref 8.4–10.5)
CHLORIDE: 103 meq/L (ref 96–112)
CO2: 20 mEq/L (ref 19–32)
CREATININE: 0.78 mg/dL (ref 0.50–1.10)
GFR calc Af Amer: >90 mL/min (ref >90)
GFR calc non Af Amer: >90 mL/min (ref >90)
Glucose, Bld: 131 mg/dL — ABNORMAL HIGH (ref 70–99)
Potassium: 3.7 mEq/L (ref 3.7–5.3)
Sodium: 137 mEq/L (ref 137–147)
TOTAL PROTEIN: 7 g/dL (ref 6.0–8.3)

## 2014-02-11 LAB — URINALYSIS, ROUTINE W REFLEX MICROSCOPIC
Bilirubin Urine: NEGATIVE
Glucose, UA: NEGATIVE mg/dL
Ketones, ur: NEGATIVE mg/dL
Nitrite: POSITIVE — AB
PH: 5 (ref 5.0–8.0)
PROTEIN: NEGATIVE mg/dL
SPECIFIC GRAVITY, URINE: 1.023 (ref 1.005–1.030)
Urobilinogen, UA: 0.2 mg/dL (ref 0.0–1.0)

## 2014-02-11 LAB — URINE MICROSCOPIC-ADD ON

## 2014-02-11 LAB — POC URINE PREG, ED: Preg Test, Ur: NEGATIVE

## 2014-02-11 MED ORDER — CIPROFLOXACIN HCL 500 MG PO TABS: 500.0000 mg | ORAL_TABLET | Freq: Two times a day (BID) | ORAL | Status: DC

## 2014-02-11 MED ORDER — ONDANSETRON HCL 4 MG/2ML IJ SOLN
4.0000 mg | Freq: Once | INTRAMUSCULAR | Status: AC
  Administered 2014-02-11: 4 mg via INTRAVENOUS
  Filled 2014-02-11: qty 2

## 2014-02-11 MED ORDER — DEXTROSE 5 % IV SOLN
1.0000 g | Freq: Once | INTRAVENOUS | Status: AC
  Administered 2014-02-11: 1 g via INTRAVENOUS
  Filled 2014-02-11: qty 10

## 2014-02-11 MED ORDER — SODIUM CHLORIDE 0.9 % IV BOLUS (SEPSIS)
1000.0000 mL | Freq: Once | INTRAVENOUS | Status: AC
  Administered 2014-02-11: 1000 mL via INTRAVENOUS

## 2014-02-11 MED ORDER — HYDROCODONE-ACETAMINOPHEN 5-325 MG PO TABS: 1.0000 | ORAL_TABLET | ORAL | Status: DC | PRN

## 2014-02-11 MED ORDER — IOHEXOL 300 MG/ML  SOLN
80.0000 mL | Freq: Once | INTRAMUSCULAR | Status: AC | PRN
  Administered 2014-02-11: 80 mL via INTRAVENOUS

## 2014-02-11 MED ORDER — ONDANSETRON 4 MG PO TBDP: ORAL_TABLET | ORAL | Status: DC

## 2014-02-11 MED ORDER — MORPHINE SULFATE 4 MG/ML IJ SOLN
4.0000 mg | Freq: Once | INTRAMUSCULAR | Status: AC
  Administered 2014-02-11: 4 mg via INTRAVENOUS
  Filled 2014-02-11: qty 1

## 2014-02-11 MED ORDER — IOHEXOL 300 MG/ML  SOLN
25.0000 mL | Freq: Once | INTRAMUSCULAR | Status: AC | PRN
  Administered 2014-02-11: 25 mL via ORAL

## 2014-02-11 NOTE — ED Provider Notes (Signed)
CSN: [id number]     Arrival date & time 02/11/14  0032 History   First MD Initiated Contact with Patient 02/11/14 0157     Chief Complaint  Patient presents with  . Abdominal Pain  . Back Pain     (Consider location/radiation/quality/duration/timing/severity/associated sxs/prior Treatment) HPI With left lower quadrant pain starting yesterday morning. She describes the pain as pressure-like with intermittent episodes of sharp pain. [name] had nausea with one episode of vomiting. The pain seems to radiate to the back. [name] had no fever or chills. She denies any vaginal bleeding or discharge. Patient admits to dysuria and frequency. Past Medical History  Diagnosis Date  . Hypertension   . Bipolar 1 disorder    Past Surgical History  Procedure Laterality Date  . C section    . Tubal ligation     No family history on file. History  Substance Use Topics  . Smoking status: Current Every Day Smoker  . Smokeless tobacco: Not on file  . Alcohol Use: No   OB History   Grav Para Term Preterm Abortions TAB SAB Ect Mult Living                 Review of Systems  Constitutional: Negative for fever and chills.  Respiratory: Negative for shortness of breath.   Cardiovascular: Negative for chest pain.  Gastrointestinal: Positive for nausea, vomiting and abdominal pain. Negative for diarrhea, constipation and blood in stool.  Genitourinary: Positive for dysuria, urgency and frequency. Negative for hematuria, vaginal bleeding, vaginal discharge, vaginal pain and pelvic pain.  Musculoskeletal: Positive for back pain. Negative for neck pain and neck stiffness.  Skin: Negative for rash and wound.  Neurological: Negative for dizziness, weakness, light-headedness, numbness and headaches.  All other systems reviewed and are negative.     Allergies  Sulfa antibiotics  Home Medications   Prior to Admission medications   Medication Sig Start Date End Date Taking? Authorizing Provider   FLUoxetine (PROZAC) 20 MG capsule Take 20 mg by mouth daily.   Yes Historical Provider, MD  Oxcarbazepine (TRILEPTAL) 300 MG tablet Take 300 mg by mouth 2 (two) times daily.   Yes Historical Provider, MD   BP 140/90  Pulse 100  Temp(Src) 97.5 F (36.4 C) (Oral)  Resp 18  SpO2 99%  LMP 12/30/2013 Physical Exam  Nursing note and vitals reviewed. Constitutional: She is oriented to person, place, and time. She appears well-developed and well-nourished. No distress.  HENT:  Head: Normocephalic and atraumatic.  Mouth/Throat: Oropharynx is clear and moist.  Eyes: EOM are normal. Pupils are equal, round, and reactive to light.  Neck: Normal range of motion. Neck supple.  Cardiovascular: Normal rate and regular rhythm.   Pulmonary/Chest: Effort normal and breath sounds normal. No respiratory distress. She has no wheezes. She has no rales.  Abdominal: Soft. Bowel sounds are normal. She exhibits no distension and no mass. There is tenderness (point tenderness in the left lower quadrant. No rebound or guarding.). There is no rebound and no guarding.  Musculoskeletal: Normal range of motion. She exhibits no edema and no tenderness.  No CVA tenderness bilaterally.  Neurological: She is alert and oriented to person, place, and time.  Moves all extremities without deficit. Sensation is grossly intact.  Skin: Skin is warm and dry. No rash noted. No erythema.  Psychiatric: She has a normal mood and affect. Her behavior is normal.    ED Course  Procedures (including critical care time) Labs Review Labs Reviewed  CBC  WITH DIFFERENTIAL - Abnormal; Notable for the following:    WBC 18.7 (*)    Neutro Abs 14.1 (*)    All other components within normal limits  COMPREHENSIVE METABOLIC PANEL - Abnormal; Notable for the following:    Glucose, Bld 131 (*)    All other components within normal limits  URINALYSIS, ROUTINE W REFLEX MICROSCOPIC - Abnormal; Notable for the following:    APPearance CLOUDY  (*)    Hgb urine dipstick MODERATE (*)    Nitrite POSITIVE (*)    Leukocytes, UA SMALL (*)    All other components within normal limits  URINE MICROSCOPIC-ADD ON - Abnormal; Notable for the following:    Squamous Epithelial / LPF FEW (*)    Bacteria, UA MANY (*)    All other components within normal limits  WET PREP, GENITAL  GC/CHLAMYDIA PROBE AMP  POC URINE PREG, ED    Imaging Review No results found.   EKG Interpretation None      MDM   Final diagnoses:  None   Patient deferred pelvic exam. She states she's had a pelvic exam recently that was normal and that she has not been sexually active. CT without any acute findings other than cholelithiasis. Patient's symptoms have improved with pain medication. I suspect patient has urinary tract infection with possible early pyelonephritis. Treat with IV Rocephin and sent home with prescription for antibiotics. Return precautions have been given.     Julianne Rice, MD 02/11/14 [phone]

## 2014-02-11 NOTE — ED Notes (Signed)
  Pt transported to ct 

## 2014-02-11 NOTE — ED Notes (Signed)
Pt. reports low abdominal pain /pressure and mid back pain with emesis ( x1) onset this morning .

## 2014-02-11 NOTE — ED Notes (Signed)
Pt ambulatory to br

## 2014-02-11 NOTE — Discharge Instructions (Signed)
Pyelonephritis, Adult Pyelonephritis is a kidney infection. A kidney infection can happen quickly, or it can last for a long time. HOME CARE   Take your medicine (antibiotics) as told. Finish it even if you start to feel better.  Keep all doctor visits as told.  Drink enough fluids to keep your pee (urine) clear or pale yellow.  Only take medicine as told by your doctor. GET HELP RIGHT AWAY IF:   You have a fever or lasting symptoms for more than 2-3 days.  You have a fever and your symptoms suddenly get worse.  You cannot take your medicine or drink fluids as told.  You have chills and shaking.  You feel very weak or pass out (faint).  You do not feel better after 2 days. MAKE SURE YOU:  Understand these instructions.  Will watch your condition.  Will get help right away if you are not doing well or get worse. Document Released: 11/23/2004 Document Revised: 04/16/2012 Document Reviewed: 04/05/2011 [facility] Patient Information 2014 [place], [place].

## 2014-02-11 NOTE — ED Notes (Signed)
Pt sts that pain is worse now.  md aware new order obtained.

## 2014-02-11 NOTE — ED Notes (Signed)
Pt dc to home. Pt sts understanding to dc instructions. Pt ambulatory to exit without difficulty.

## 2014-02-11 NOTE — ED Notes (Signed)
Dr. Lita Mains at bedside for pelvic exam. Pt sts that she doesn't think that she needs to have the pelvic done since she just had one done recently.  Dr. Lita Mains sts that it is fine and we will treat for uti and discharge home.

## 2014-11-12 ENCOUNTER — Emergency Department (HOSPITAL_COMMUNITY): Payer: Self-pay

## 2014-11-12 ENCOUNTER — Emergency Department (HOSPITAL_COMMUNITY)
Admission: EM | Admit: 2014-11-12 | Discharge: 2014-11-12 | Disposition: A | Discharge status: Home / Self Care | Payer: Self-pay | Attending: Emergency Medicine | Admitting: Emergency Medicine

## 2014-11-12 ENCOUNTER — Encounter (HOSPITAL_COMMUNITY): Payer: Self-pay | Admitting: Emergency Medicine

## 2014-11-12 DIAGNOSIS — I10 Essential (primary) hypertension: Secondary | ICD-10-CM | POA: Insufficient documentation

## 2014-11-12 DIAGNOSIS — M549 Dorsalgia, unspecified: Secondary | ICD-10-CM

## 2014-11-12 DIAGNOSIS — Z79899 Other long term (current) drug therapy: Secondary | ICD-10-CM | POA: Insufficient documentation

## 2014-11-12 DIAGNOSIS — S39012A Strain of muscle, fascia and tendon of lower back, initial encounter: Secondary | ICD-10-CM | POA: Insufficient documentation

## 2014-11-12 DIAGNOSIS — Z3202 Encounter for pregnancy test, result negative: Secondary | ICD-10-CM | POA: Insufficient documentation

## 2014-11-12 DIAGNOSIS — Y9389 Activity, other specified: Secondary | ICD-10-CM | POA: Insufficient documentation

## 2014-11-12 DIAGNOSIS — Z720 Tobacco use: Secondary | ICD-10-CM | POA: Insufficient documentation

## 2014-11-12 DIAGNOSIS — F319 Bipolar disorder, unspecified: Secondary | ICD-10-CM | POA: Insufficient documentation

## 2014-11-12 DIAGNOSIS — Y9289 Other specified places as the place of occurrence of the external cause: Secondary | ICD-10-CM | POA: Insufficient documentation

## 2014-11-12 DIAGNOSIS — W1839XA Other fall on same level, initial encounter: Secondary | ICD-10-CM | POA: Insufficient documentation

## 2014-11-12 DIAGNOSIS — Y990 Civilian activity done for income or pay: Secondary | ICD-10-CM | POA: Insufficient documentation

## 2014-11-12 DIAGNOSIS — Z792 Long term (current) use of antibiotics: Secondary | ICD-10-CM | POA: Insufficient documentation

## 2014-11-12 LAB — POC URINE PREG, ED: Preg Test, Ur: NEGATIVE

## 2014-11-12 MED ORDER — CYCLOBENZAPRINE HCL 10 MG PO TABS: 5.0000 mg | ORAL_TABLET | Freq: Two times a day (BID) | ORAL | Status: DC | PRN

## 2014-11-12 MED ORDER — HYDROCODONE-ACETAMINOPHEN 5-325 MG PO TABS: 1.0000 | ORAL_TABLET | Freq: Four times a day (QID) | ORAL | Status: DC | PRN

## 2014-11-12 MED ORDER — HYDROCODONE-ACETAMINOPHEN 5-325 MG PO TABS
1.0000 | ORAL_TABLET | Freq: Once | ORAL | Status: AC
  Administered 2014-11-12: 1 via ORAL
  Filled 2014-11-12: qty 1

## 2014-11-12 NOTE — ED Provider Notes (Signed)
CSN: [id number]     Arrival date & time 11/12/14  1529 History  This chart was scribed for non-physician practitioner, Jamse Mead, PA-C working with Johnna Acosta, MD by Tula Nakayama, ED scribe. This patient was seen in room TR09C/TR09C and the patient's care was started at 4:24 PM  Chief Complaint  Patient presents with  . Back Pain   The history is provided by the patient. No language interpreter was used.    HPI Comments: Barbara Ellis is a 44 y.o. female with past medical history of hypertension and bipolar disorder who presents to the Emergency Department complaining of constant lower back pain that radiates to her bilateral hips and right thigh and started 5 hours ago. 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 She notes feeling a pulling of her muscles when she tried to stabilize him. Pt reports increasing tightness that started 10-15 minutes after the incident and states that pain becomes worse with sitting on her right buttocks, ambulation and standing. She denies direct trauma or falls. Pt has not tried any treatment for her symptoms PTA. Pt was diagnosed with a kidney infection in April, but denies re-occurance of urinary symptoms. She denies any history of back pain, injuries and surgeries. She also denies numbness, tingling, urinary and bowel incontinence, weakness, abdominal pain. No PCP  Past Medical History  Diagnosis Date  . Hypertension   . Bipolar 1 disorder    Past Surgical History  Procedure Laterality Date  . C section    . Tubal ligation     History reviewed. No pertinent family history. History  Substance Use Topics  . Smoking status: Current Every Day Smoker  . Smokeless tobacco: Not on file  . Alcohol Use: No   OB History    No data  available     Review of Systems  Gastrointestinal: Negative for abdominal pain.  Genitourinary: Negative for dysuria and hematuria.  Musculoskeletal: Positive for back pain. Negative for joint swelling, neck pain and neck stiffness.  Skin: Negative for wound.  Neurological: Negative for weakness and numbness.      Allergies  Sulfa antibiotics  Home Medications   Prior to Admission medications   Medication Sig Start Date End Date Taking? Authorizing Provider  ciprofloxacin (CIPRO) 500 MG tablet Take 1 tablet (500 mg total) by mouth 2 (two) times daily. One po bid x 7 days 02/11/14   Julianne Rice, MD  cyclobenzaprine (FLEXERIL) 10 MG tablet Take 0.5 tablets (5 mg total) by mouth 2 (two) times daily as needed for muscle spasms. 11/12/14   Chrishelle Zito, PA-C  FLUoxetine (PROZAC) 20 MG capsule Take 20 mg by mouth daily.    Historical Provider, MD  HYDROcodone-acetaminophen (NORCO/VICODIN) 5-325 MG per tablet Take 1 tablet by mouth every 6 (six) hours as needed. 11/12/14   Serapio Edelson, PA-C  ondansetron (ZOFRAN ODT) 4 MG disintegrating tablet 4mg  ODT q4 hours prn nausea/vomit 02/11/14   Julianne Rice, MD  Oxcarbazepine (TRILEPTAL) 300 MG tablet Take 300 mg by mouth 2 (two) times daily.    Historical Provider, MD   BP 144/88 mmHg  Pulse 84  Temp(Src) 98.6 F (37 C) (Oral)  Resp 16  SpO2 98%  LMP 09/25/2014 Physical Exam  Constitutional: She is oriented to  person, place, and time. She appears well-developed and well-nourished. No distress.  HENT:  Head: Normocephalic and atraumatic.  Eyes: Conjunctivae and EOM are normal. Right eye exhibits no discharge. Left eye exhibits no discharge.  Neck: Normal range of motion. Neck supple.  Cardiovascular: Normal rate, regular rhythm and normal heart sounds.  Exam reveals no friction rub.   No murmur heard. Pulses:      Radial pulses are 2+ on the right side, and 2+ on the left side.       Dorsalis pedis pulses are 2+ on the right  side, and 2+ on the left side.  Pulmonary/Chest: Effort normal and breath sounds normal. No respiratory distress. She has no wheezes. She has no rales.  Musculoskeletal: She exhibits tenderness.       Lumbar back: She exhibits tenderness. She exhibits normal range of motion, no bony tenderness, no swelling, no edema and no deformity.       Back:  Negative swelling, erythema, inflammation, lesions, sores, deformities identified to the spine. Discomfort upon palpation to the mid lumbosacral spine and paravertebral regions bilaterally, right more so than left. Discomfort upon palpation to the right buttock. Full range of motion to upper and lower extremities identified without difficulty or ataxia noted on examination.  Neurological: She is alert and oriented to person, place, and time. No cranial nerve deficit. She exhibits normal muscle tone. Coordination normal.  Cranial nerves III-XII grossly intact Strength 5+/5+ to upper and lower extremities bilaterally with resistance applied, equal distribution noted Sensation intact with differentiation to sharp and dull touch Negative saddle paresthesias bilaterally Negative arm drift Fine motor skills intact Gait proper, proper balance - negative sway, negative drift, negative step-offs  Skin: Skin is warm and dry. No rash noted. She is not diaphoretic. No erythema.  Psychiatric: She has a normal mood and affect. Her behavior is normal. Thought content normal.  Nursing note and vitals reviewed.   ED Course  Procedures (including critical care time) DIAGNOSTIC STUDIES: Oxygen Saturation is 98% on RA, normal by my interpretation.    COORDINATION OF CARE: 4:34 PM Discussed treatment plan with pt which includes x-rays of sacrum/coccyx and lumbar spine and lab work. Pt agreed to plan.    Labs Review Labs Reviewed  POC URINE PREG, ED    Imaging Review Dg Lumbar Spine Complete  11/12/2014   CLINICAL DATA:  Injured at work today. Pain in the  lower lumbar spine radiating into the right side in the femur region. Pain also radiates to the left. Pain radiates to left hip. 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 Pain began radiating about 30 - 40 minutes after injury.  EXAM: LUMBAR SPINE - COMPLETE 4+ VIEW  COMPARISON:  CT of the abdomen and pelvis 02/11/2014  FINDINGS: There is no evidence of lumbar spine fracture. Alignment is normal. Intervertebral disc spaces are maintained.  IMPRESSION: Negative.   Electronically Signed   By: Shon Hale M.D.   On: 11/12/2014 18:10   Dg Sacrum/coccyx  11/12/2014   CLINICAL DATA:  Injured at work while assisting a patient, now with low back pain radiating to the right.  EXAM: SACRUM AND COCCYX - 2+ VIEW  COMPARISON:  Lumbar spine radiographs - earlier same day; CT abdomen pelvis -02/01/2014  FINDINGS: No displaced sacral or coccygeal fracture. There is a minimal amount of sclerosis about the caudal aspects of the bilateral SI joints, similar to prior abdominal CT  performed 01/2014. The pubic symphysis appear normal. Limited visualization the bilateral hips is normal. Several phleboliths overlie the lower pelvis bilaterally and at the midline. No radiopaque foreign body.  IMPRESSION: 1. No displaced sacral or coccygeal fracture. 2. Potential symmetric bilateral sacroiliitis, nonspecific though could be seen in the setting of inflammatory etiologies such as inflammatory bowel disease. Clinical correlation is advised.   Electronically Signed   By: Sandi Mariscal M.D.   On: 11/12/2014 18:19     EKG Interpretation None      MDM   Final diagnoses:  Back pain  Lumbar strain, initial encounter    Medications  HYDROcodone-acetaminophen (NORCO/VICODIN) 5-325 MG per tablet 1 tablet (1 tablet Oral Given 11/12/14 1714)   Filed Vitals:   11/12/14 1551 11/12/14 1559  BP: 175/115 144/88  Pulse: 88 84  Temp: 98.2 F (36.8 C) 98.6 F (37 C)   TempSrc: Oral Oral  Resp: 18 16  SpO2: 98% 98%   I personally performed the services described in this documentation, which was scribed in my presence. The recorded information has been reviewed and is accurate.  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 Stated that she's been feeling a pulling, tightness sensation localized to lower back with radiation into her right buttock-reported that the pain is worse with walking secondary to pulling sensation and standing/sitting for long periods of time. Urine pregnancy negative. Plain film of lumbar spine negative for acute osseous injury. Plain film of sacrum no displaced sacrococcygeal fracture-potential symmetric bilateral sacroiliitis, nonspecific though could be seen in the setting of inflammatory etiology such as inflammatory bowel disease-clinical correlation is advised. Negative focal neurological deficits noted. Sensation intact. Strength intact with equal distribution. Gait proper with negative step-offs or sway. Doubt cauda equina. Doubt epidural abscess. Doubt urinary infection. Doubt IBS-patient strenuous activity that occurred today with heavy lifting - denied abdominal pain. Suspicion to be lumbar strain secondary to mechanism of injury. Patient stable, afebrile. Patient not septic appearing. Discharged patient. [facility] and [facility]. Discharged patient with pain medications-discussed course, precautions, disposal technique. Discussed with patient to rest and stay hydrated. Discussed with patient to avoid any physical or strenuous activity. Discussed with patient to apply heat and massage with icy hot ointment. Discussed with patient to closely monitor  symptoms and if symptoms are to worsen or change to report back to the ED - strict return instructions given.  Patient agreed to plan of care, understood, all questions answered.   Jamse Mead, PA-C 11/12/14 1932  Johnna Acosta, MD 11/13/14 [phone]

## 2014-11-12 NOTE — ED Notes (Signed)
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

## 2014-11-12 NOTE — Discharge Instructions (Signed)
Please call your doctor for a followup appointment within 24-48 hours. When you talk to your doctor please let them know that you were seen in the emergency department and have them acquire all of your records so that they can discuss the findings with you and formulate a treatment plan to fully care for your new and ongoing problems. Please follow-up with orthopedics Please rest and stay hydrated Please massage with icy hot ointment and apply heat and warm compressions Please avoid any physical or shortness activity, please no heavy lifting Please take medications as prescribed - while on pain medications there is to be no drinking alcohol, driving, operating any heavy machinery. If extra please dispose in a proper manner. Please do not take any extra Tylenol with this medication for this can lead to Tylenol overdose and liver issues.  Please take medications on a full stomach While taking muscle relaxers, Flexeril, there is to be no drinking alcohol, driving, operating any heavy machinery for this can cause drowsiness Please continue to monitor symptoms closely and if symptoms are to worsen or change (fever greater than 101, chills, sweating, nausea, vomiting, chest pain, shortness of breathe, difficulty breathing, weakness, numbness, tingling, worsening or changes to pain pattern, fall, injury, inability to control urine or bowel movements) please report back to the Emergency Department immediately.   Lumbosacral Strain Lumbosacral strain is a strain of any of the parts that make up your lumbosacral vertebrae. Your lumbosacral vertebrae are the bones that make up the lower third of your backbone. Your lumbosacral vertebrae are held together by muscles and tough, fibrous tissue (ligaments).  CAUSES  A sudden blow to your back can cause lumbosacral strain. Also, anything that causes an excessive stretch of the muscles in the low back can cause this strain. This is typically seen when people exert  themselves strenuously, fall, lift heavy objects, bend, or crouch repeatedly. RISK FACTORS  Physically demanding work.  Participation in pushing or pulling sports or sports that require a sudden twist of the back (tennis, golf, baseball).  Weight lifting.  Excessive lower back curvature.  Forward-tilted pelvis.  Weak back or abdominal muscles or both.  Tight hamstrings. SIGNS AND SYMPTOMS  Lumbosacral strain may cause pain in the area of your injury or pain that moves (radiates) down your leg.  DIAGNOSIS Your health care provider can often diagnose lumbosacral strain through a physical exam. In some cases, you may need tests such as X-ray exams.  TREATMENT  Treatment for your lower back injury depends on many factors that your clinician will have to evaluate. However, most treatment will include the use of anti-inflammatory medicines. HOME CARE INSTRUCTIONS   Avoid hard physical activities (tennis, racquetball, waterskiing) if you are not in proper physical condition for it. This may aggravate or create problems.  If you have a back problem, avoid sports requiring sudden body movements. Swimming and walking are generally safer activities.  Maintain good posture.  Maintain a healthy weight.  For acute conditions, you may put ice on the injured area.  Put ice in a plastic bag.  Place a towel between your skin and the bag.  Leave the ice on for 20 minutes, 2-3 times a day.  When the low back starts healing, stretching and strengthening exercises may be recommended. SEEK MEDICAL CARE IF:  Your back pain is getting worse.  You experience severe back pain not relieved with medicines. SEEK IMMEDIATE MEDICAL CARE IF:   You have numbness, tingling, weakness, or problems with the  use of your arms or legs.  There is a change in bowel or bladder control.  You have increasing pain in any area of the body, including your belly (abdomen).  You notice shortness of breath,  dizziness, or feel faint.  You feel sick to your stomach (nauseous), are throwing up (vomiting), or become sweaty.  You notice discoloration of your toes or legs, or your feet get very cold. MAKE SURE YOU:   Understand these instructions.  Will watch your condition.  Will get help right away if you are not doing well or get worse. Document Released: 07/26/2005 Document Revised: 10/21/2013 Document Reviewed: 06/04/2013 [facility] Patient Information 2015 [place], [place]. This information is not intended to replace advice given to you by your health care provider. Make sure you discuss any questions you have with your health care provider.   Emergency Department Resource Guide 1) Find a Doctor and Pay Out of Pocket Although you won't have to find out who is covered by your insurance plan, it is a good idea to ask around and get recommendations. You will then need to call the office and see if the doctor you have chosen will accept you as a new patient and what types of options they offer for patients who are self-pay. Some doctors offer discounts or will set up payment plans for their patients who do not have insurance, but you will need to ask so you aren't surprised when you get to your appointment.  2) Contact Your Local Health Department Not all health departments have doctors that can see patients for sick visits, but many do, so it is worth a call to see if yours does. If you don't know where your local health department is, you can check in your phone book. The CDC also has a tool to help you locate your state's health department, and many state websites also have listings of all of their local health departments.  3) Find a [facility] If your illness is not likely to be very severe or complicated, you may want to try a walk in clinic. These are popping up all over the country in pharmacies, drugstores, and shopping centers. They're usually staffed by nurse practitioners or physician  assistants that have been trained to treat common illnesses and complaints. They're usually fairly quick and inexpensive. However, if you have serious medical issues or chronic medical problems, these are probably not your best option.  No Primary Care Doctor: - Call Health Connect at  [phone] - they can help you locate a primary care doctor that  accepts your insurance, provides certain services, etc. - Physician Referral Service- [phone]  Chronic Pain Problems: Organization         Address  Phone   Notes  [facility]  [phone] Patients need to be referred by their primary care doctor.   Medication Assistance: Organization         Address  Phone   Notes  [facility] [place] [address] [place], [place] [address] [phone] --Must be a resident of [facility] -- Must have NO insurance coverage whatsoever (no Medicaid/ Medicare, etc.) -- The pt. MUST have a primary care doctor that directs their care regularly and follows them in the community   MedAssist  [phone]   [employer]  [phone]    Agencies that provide inexpensive medical care: Patent attorney  Notes  [place]  [phone]   [facility]    [phone]   [facility] [place], [place] [address] [phone]   [place] [address] [place] [phone]   Planned Parenthood    [phone]   [facility]    [phone]   [place] and [place] [address], [place] Phone:  [phone], Fax:  [phone] Hours of Operation:  9 am - 6 pm, M-F.  Also accepts Medicaid/Medicare and self-pay.  [facility] for [place] [address], [address], [place] Phone: [phone], Fax: [phone]. Hours of Operation:  8:30 am - 5:30 pm, M-F.  Also accepts  Medicaid and self-pay.  [facility] [place] [address], [place] Phone: [phone]   [place], [place], [place] [phone], Ext. [phone] Mondays & Thursdays: 7-9 AM.  First 15 patients are seen on a first come, first serve basis.    [place] Providers:  Organization         Address  Phone   Notes  [facility] [address],  [phone] Also accepts self-pay patients.  [facility] [address], [place]  [phone]   [place], [address], [place] [phone]   [facility] [address] [place] [phone]   Lucianne Lei [address], [place]   [phone] Only accepts [place] Access [place] patients after they have their name applied to their card.   Self-Pay (no insurance) in [facility]:  Organization         Address  Phone   Notes  Sickle Cell Patients, [facility] [place] [phone]   [facility] [place] [phone]   [facility] [place]  [place], [place], [place] [phone]   [facility]/Dr. Osei-Bonsu  [address] [place] or [address], [place] [phone] Phone number for both [place] and [place] locations is the same.  [facility] [address] [place] [phone]   [facility] [address] [place] or [address] [phone]   [facility] [address], [place] [phone], phone; [phone], fax Sees patients 1st and 3rd Saturday of every month.  Must not qualify for public or private insurance (i.e. Medicaid, Medicare, [place] Health Choice, Veterans' Benefits)  Household  income should be no more than 200% of the poverty level The clinic cannot treat you if you are pregnant or think you are pregnant  Sexually transmitted diseases are not treated at the clinic.    Dental Care: Organization         Address  Phone  Notes  [facility] Department of [facility] [place] [phone] Accepts children up to age 59 who are enrolled in [place] or [place]; pregnant women with a Medicaid card; and children who have applied for Medicaid or [place] Health Choice, but were declined, whose parents can pay a reduced fee at time of service.  [facility]  Department of [facility]  [address], [place] [phone] Accepts children up to age 84 who are enrolled in [place] or [place]; pregnant women with a Medicaid card; and children who have applied for Medicaid or [place] Health Choice, but were declined, whose parents can pay a reduced fee at time of service.  [facility] Adult [facility] Access PROGRAM  [place] [phone] Patients are seen by appointment only. Walk-ins are not accepted. [place] will see patients 69 years of age and older. Monday - Tuesday (8am-5pm) Most Wednesdays (8:30-5pm) $30 per visit, cash only  [facility] Adult Dental Access PROGRAM  [address], [facility] [phone] Patients are seen by appointment only. Walk-ins are not accepted. [place] will see patients 24 years of age and older. One Wednesday Evening (Monthly: Volunteer Based).  $30 per visit, cash only  [place]  [phone] for adults; Children under age 49, call Graduate Pediatric Dentistry at [phone]. Children aged 70-14, please call [phone] to request a pediatric application.  Dental services are provided in all areas of dental care including fillings, crowns and bridges, complete and partial dentures, implants, gum  treatment, root canals, and extractions. Preventive care is also provided. Treatment is provided to both adults and children. Patients are selected via a lottery and there is often a waiting list.   [facility] [address] [place]  [phone] [email or web address]   [facility] [address] [place], [place] [phone], Ext. [phone] Second and Fourth Thursday of each month, opens at 6:30 AM; Clinic ends at 9 AM.  Patients are seen on a first-come first-served basis, and a limited number are seen during each clinic.   [facility]  [address] Hillard Danker [place], [place] [phone]   Eligibility Requirements You must have lived in [place], [place], or [place] counties for at least the last three months.   You cannot be eligible for state or federal sponsored [employer], including [employer], [place], or [employer].   You generally cannot be eligible for healthcare insurance through your employer.    How to apply: Eligibility screenings are held every Tuesday and Wednesday afternoon from 1:00 pm until 4:00 pm. You do not need an appointment for the interview!  [facility] [address], [place], [place]   [place]  [place] Department  [place]  [phone]    Behavioral Health Resources in the Community: Intensive Outpatient Programs Organization         Address  Phone  Notes  [place]. [address], [place], [place] [phone]   [facility] Outpatient [address], [place], [place]   ADS: Alcohol & Drug Svcs [address], [place], [place]   [place] [address]  [place], [place] or [phone]   Substance Abuse Resources Organization         Address  Phone  Notes  Alcohol and  Drug Services  [phone]   [place]  [phone]   [facility] [place]   Chinita Pester  [phone]   Residential & Outpatient Substance Abuse Program  [phone]   Psychological Services Organization         Address  Phone  Notes  [facility]  [place]   [place] [address], [place] or [phone]    Mobile Crisis Teams Organization         Address  Phone  Notes  [facility], Mobile Crisis Care Unit  [phone]   [facility]ive Psychotherapeutic Services  [address] [place], [place]   [place] [address], [place]ian [place] [phone]    Self-Help/Support Groups Organization         Address  Phone             Notes  [place]. of [facility] - variety of support groups  [place] Call for more information  Narcotics Anonymous (NA), [facility] [address], [employer] [place]  2 meetings at this location   Special educational needs teacher         Address  Phone  Notes  [facility] [place],    [place]  [phone]   [facility]  [address], [place] [address], [place], [place]   [place], [place] [phone] Admissions: 8am-3pm M-F  Incentives Substance [place] [address]    [place], [place] [phone]   [facility] [address] [place], [place], [place]   [facility] [address]  [place], [place]   [facility] - Intensive Outpatient [address], [place], [place]   [facility] ([place].,  [place], [place] [phone] or [phone]   [facility] (RTS) [address] [place], [place] Accepts Medicaid  [facility]  [place] [address],  [place] [phone] Substance Abuse/Addiction Treatment   [facility] Organization         Address  Phone  Notes  [facility]  [phone]   Domenic Schwab, PhD [address] [place]lis [place], [place]   [phone] or [phone]   [place], [place] [phone]   [facility] [address], [place], [place] [phone] Insurance/Medicaid/sponsorship through [facility] and Families [address] [place]                                    [place], [place] [phone] [place] [address], [place] [phone]    Dr. Adele Schilder  [phone]   [facility] Dept. 1) [address]  2) [place] 3)  [place] [address], [place] [phone]  [phone]   [place] [phone] or [phone] (After Hours)
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
## 2014-11-17 ENCOUNTER — Telehealth (HOSPITAL_BASED_OUTPATIENT_CLINIC_OR_DEPARTMENT_OTHER): Payer: Self-pay | Admitting: Emergency Medicine

## 2015-01-11 ENCOUNTER — Encounter (HOSPITAL_COMMUNITY): Payer: Self-pay | Admitting: Emergency Medicine

## 2015-01-11 DIAGNOSIS — Z8659 Personal history of other mental and behavioral disorders: Secondary | ICD-10-CM | POA: Insufficient documentation

## 2015-01-11 DIAGNOSIS — I10 Essential (primary) hypertension: Secondary | ICD-10-CM | POA: Insufficient documentation

## 2015-01-11 DIAGNOSIS — Z720 Tobacco use: Secondary | ICD-10-CM | POA: Insufficient documentation

## 2015-01-11 DIAGNOSIS — Z791 Long term (current) use of non-steroidal anti-inflammatories (NSAID): Secondary | ICD-10-CM | POA: Insufficient documentation

## 2015-01-11 DIAGNOSIS — M7989 Other specified soft tissue disorders: Secondary | ICD-10-CM | POA: Insufficient documentation

## 2015-01-11 NOTE — ED Notes (Signed)
Pt reports having small raised area to right shoulder blade since 2010- 2 days ago site got large in size and became tender to touch.  Large raised area tender to touch present to right shoulder blade, no drainage noted from site.  Pt reports pain with movement.

## 2015-01-12 ENCOUNTER — Emergency Department (HOSPITAL_COMMUNITY)
Admission: EM | Admit: 2015-01-12 | Discharge: 2015-01-12 | Disposition: A | Discharge status: Home / Self Care | Payer: Self-pay | Attending: Emergency Medicine | Admitting: Emergency Medicine

## 2015-01-12 DIAGNOSIS — R609 Edema, unspecified: Secondary | ICD-10-CM

## 2015-01-12 LAB — COMPREHENSIVE METABOLIC PANEL
ALT: 16 U/L (ref 0–35)
AST: 18 U/L (ref 0–37)
Albumin: 3.7 g/dL (ref 3.5–5.2)
Alkaline Phosphatase: 56 U/L (ref 39–117)
Anion gap: 7 (ref 5–15)
BUN: 9 mg/dL (ref 6–23)
CALCIUM: 9.1 mg/dL (ref 8.4–10.5)
CO2: 22 mmol/L (ref 19–32)
Chloride: 110 mmol/L (ref 96–112)
Creatinine, Ser: 0.79 mg/dL (ref 0.50–1.10)
GFR calc non Af Amer: >90 mL/min (ref >90)
GLUCOSE: 103 mg/dL — AB (ref 70–99)
Potassium: 4.1 mmol/L (ref 3.5–5.1)
Sodium: 139 mmol/L (ref 135–145)
TOTAL PROTEIN: 6.8 g/dL (ref 6.0–8.3)
Total Bilirubin: 0.3 mg/dL (ref 0.3–1.2)

## 2015-01-12 LAB — CBC
HCT: 45.5 % (ref 36.0–46.0)
Hemoglobin: 16 g/dL — ABNORMAL HIGH (ref 12.0–15.0)
MCH: 33.3 pg (ref 26.0–34.0)
MCHC: 35.2 g/dL (ref 30.0–36.0)
MCV: 94.8 fL (ref 78.0–100.0)
PLATELETS: 309 10*3/uL (ref 150–400)
RBC: 4.8 MIL/uL (ref 3.87–5.11)
RDW: 13.1 % (ref 11.5–15.5)
WBC: 14.4 10*3/uL — AB (ref 4.0–10.5)

## 2015-01-12 MED ORDER — LIDOCAINE HCL (PF) 1 % IJ SOLN
30.0000 mL | Freq: Once | INTRAMUSCULAR | Status: AC
Start: 2015-01-12 — End: 2015-01-12
  Administered 2015-01-12: 30 mL
  Filled 2015-01-12: qty 30

## 2015-01-12 MED ORDER — HYDROCODONE-ACETAMINOPHEN 5-325 MG PO TABS: 1.0000 | ORAL_TABLET | ORAL | Status: DC | PRN

## 2015-01-12 MED ORDER — IBUPROFEN 200 MG PO TABS
600.0000 mg | ORAL_TABLET | Freq: Once | ORAL | Status: DC
  Filled 2015-01-12: qty 3

## 2015-01-12 MED ORDER — ACETAMINOPHEN 325 MG PO TABS
650.0000 mg | ORAL_TABLET | Freq: Once | ORAL | Status: DC
  Filled 2015-01-12: qty 2

## 2015-01-12 NOTE — ED Notes (Signed)
Pt states "I do not feel comfortable going home with this" Dr Reather Converse in room to speak with pt.

## 2015-01-12 NOTE — ED Notes (Signed)
Pt verbalized understanding of d/c instructions and has no further questions.  

## 2015-01-12 NOTE — ED Provider Notes (Addendum)
CSN: [id number]     Arrival date & time 01/11/15  2323 History   First MD Initiated Contact with Patient 01/12/15 0430     Chief Complaint  Patient presents with  . Shoulder Pain     (Consider location/radiation/quality/duration/timing/severity/associated sxs/prior Treatment) HPI Comments: 44 year old female with high blood pressure history presents with right posterior scapula swelling and tenderness. Patient has had a small area of swelling for years however last 24 hours became increasingly larger and tender. No warmth to it, no fevers or chills, no injuries. No other masses or swellings anywhere else. Located along scapular border on the right. Worse with moving shoulder posteriorly  Patient is a 44 y.o. female presenting with shoulder pain. The history is provided by the patient.  Shoulder Pain Associated symptoms: no back pain, no fever and no neck pain     Past Medical History  Diagnosis Date  . Hypertension   . Bipolar 1 disorder    Past Surgical History  Procedure Laterality Date  . C section    . Tubal ligation     No family history on file. History  Substance Use Topics  . Smoking status: Current Every Day Smoker  . Smokeless tobacco: Not on file  . Alcohol Use: No   OB History    No data available     Review of Systems  Constitutional: Negative for fever and chills.  HENT: Negative for congestion.   Eyes: Negative for visual disturbance.  Respiratory: Negative for shortness of breath.   Cardiovascular: Negative for chest pain.  Gastrointestinal: Negative for vomiting and abdominal pain.  Genitourinary: Negative for dysuria and flank pain.  Musculoskeletal: Negative for back pain, neck pain and neck stiffness.  Skin: Negative for rash.  Neurological: Negative for light-headedness and headaches.      Allergies  Sulfa antibiotics  Home Medications   Prior to Admission medications   Medication Sig Start Date End Date Taking? Authorizing Provider   cyclobenzaprine (FLEXERIL) 10 MG tablet Take 0.5 tablets (5 mg total) by mouth 2 (two) times daily as needed for muscle spasms. 11/12/14  Yes Marissa Sciacca, PA-C  meloxicam (MOBIC) 7.5 MG tablet Take 7.5 mg by mouth daily.   Yes Historical Provider, MD  ciprofloxacin (CIPRO) 500 MG tablet Take 1 tablet (500 mg total) by mouth 2 (two) times daily. One po bid x 7 days Patient not taking: Reported on 01/12/2015 02/11/14   Julianne Rice, MD  HYDROcodone-acetaminophen [facility]) 5-325 MG per tablet Take 1 tablet by mouth every 4 (four) hours as needed. 01/12/15   Elnora Morrison, MD  ondansetron (ZOFRAN ODT) 4 MG disintegrating tablet 4mg  ODT q4 hours prn nausea/vomit Patient not taking: Reported on 01/12/2015 02/11/14   Julianne Rice, MD   BP 128/95 mmHg  Pulse 68  Temp(Src) 97.9 F (36.6 C) (Oral)  Resp 16  Ht 5\' 9"  (1.753 m)  Wt 225 lb (102.059 kg)  BMI 33.21 kg/m2  SpO2 100%  LMP 12/28/2014 Physical Exam  Constitutional: She is oriented to person, place, and time. She appears well-developed and well-nourished.  HENT:  Head: Normocephalic and atraumatic.  Eyes: Conjunctivae are normal. Right eye exhibits no discharge. Left eye exhibits no discharge.  Neck: Normal range of motion. Neck supple. No tracheal deviation present.  Cardiovascular: Normal rate and regular rhythm.   Pulmonary/Chest: Effort normal and breath sounds normal.  Abdominal: Soft. She exhibits no distension. There is no tenderness. There is no guarding.  Musculoskeletal: She exhibits tenderness. She exhibits no edema.  Patient has mild tenderness and swelling along scapular border on the right, no induration, no crepitus, no warmth or streaking, pain with moving arm and shoulder posteriorly on the right. No midline tenderness. Movable.  Neurological: She is alert and oriented to person, place, and time.  Skin: Skin is warm. No rash noted.  Psychiatric: She has a normal mood and affect.  Nursing note and vitals  reviewed.   ED Course  Procedures (including critical care time)  EMERGENCY DEPARTMENT US SOFT TISSUE INTERPRETATION "Study: Limited Soft Tissue Ultrasound"  INDICATIONS: Pain Multiple views of the body part were obtained in real-time with a multi-frequency linear probe PERFORMED BY:  Myself IMAGES ARCHIVED?: Yes SIDE:Right  BODY PART:Upper back FINDINGS: No abcess noted and Cellulitis absent INTERPRETATION:  No abcess noted and No cellulitis noted  Needle aspiration/incision and drainage attempt Discussed risks and benefits, patient requesting to see if any material obtained. Lidocaine used, sterile technique, patient tolerated well and no fluid was obtained with attempts of aspiration beneath the site of swelling. Performed by myself   Labs Review Labs Reviewed  CBC - Abnormal; Notable for the following:    WBC 14.4 (*)    Hemoglobin 16.0 (*)    All other components within normal limits  COMPREHENSIVE METABOLIC PANEL - Abnormal; Notable for the following:    Glucose, Bld 103 (*)    All other components within normal limits    Imaging Review No results found.   EKG Interpretation None      MDM   Final diagnoses:  Soft tissue swelling of back   Clinical concern for lipoma versus cyst. Discussed other rare possibilities of patient will need close outpatient follow-up for patient well-appearing no signs of infection at this time. Bedside ultrasound no fluid collection, no abscess. Discuss with patient low likelihood of obtaining fluid with negative ultrasound, patient requested attempt, she understands risks and benefits and outpatient follow-up. Patient is frustrated due to swelling and pain. I discussed I did not feel the risks of radiation to perform CT was indicated at this time with increased swelling for 24 hours and no sign of significant infection. Patient will follow-up closely outpatient or return to the ER if no improvement in one week. Tylenol for pain.   Results and differential diagnosis were discussed with the patient/parent/guardian. Close follow up outpatient was discussed, comfortable with the plan.   Medications  ibuprofen (ADVIL,MOTRIN) tablet 600 mg (600 mg Oral Not Given 01/12/15 0516)  lidocaine (PF) (XYLOCAINE) 1 % injection 30 mL (30 mLs Infiltration Given 01/12/15 0527)    Filed Vitals:   01/12/15 0500 01/12/15 0515 01/12/15 0530 01/12/15 0557  BP: 138/95 149/95 128/93 128/95  Pulse: 88 84 84 68  Temp:    97.9 F (36.6 C)  TempSrc:    Oral  Resp: 16 15  16   Height:      Weight:      SpO2: 99% 100% 99% 100%    Final diagnoses:  Soft tissue swelling of back        Elnora Morrison, MD 01/12/15 0505  Elnora Morrison, MD 01/12/15 [phone]

## 2015-01-12 NOTE — Discharge Instructions (Signed)
Follow-up with primary Dr. for further evaluation. If you do well fevers, worsening pain, neck stiffness or other concerns come to the ER. Tylenol for pain.   If you were given medicines take as directed.  If you are on coumadin or contraceptives realize their levels and effectiveness is altered by many different medicines.  If you have any reaction (rash, tongues swelling, other) to the medicines stop taking and see a physician.   Please follow up as directed and return to the ER or see a physician for new or worsening symptoms.  Thank you. Filed Vitals:   01/11/15 2329 01/12/15 0438  BP: 128/84 130/82  Pulse: 114 94  Temp: 99.3 F (37.4 C)   TempSrc: Oral   Resp: 16 18  SpO2: 95% 98%  :

## 2015-05-25 ENCOUNTER — Emergency Department (HOSPITAL_COMMUNITY): Payer: Self-pay

## 2015-05-25 ENCOUNTER — Encounter (HOSPITAL_COMMUNITY): Payer: Self-pay | Admitting: *Deleted

## 2015-05-25 ENCOUNTER — Emergency Department (HOSPITAL_COMMUNITY)
Admission: EM | Admit: 2015-05-25 | Discharge: 2015-05-25 | Disposition: A | Discharge status: Home / Self Care | Payer: Self-pay | Attending: Emergency Medicine | Admitting: Emergency Medicine

## 2015-05-25 DIAGNOSIS — Z8659 Personal history of other mental and behavioral disorders: Secondary | ICD-10-CM | POA: Insufficient documentation

## 2015-05-25 DIAGNOSIS — Z720 Tobacco use: Secondary | ICD-10-CM | POA: Insufficient documentation

## 2015-05-25 DIAGNOSIS — Y990 Civilian activity done for income or pay: Secondary | ICD-10-CM | POA: Insufficient documentation

## 2015-05-25 DIAGNOSIS — Y9289 Other specified places as the place of occurrence of the external cause: Secondary | ICD-10-CM | POA: Insufficient documentation

## 2015-05-25 DIAGNOSIS — Z760 Encounter for issue of repeat prescription: Secondary | ICD-10-CM | POA: Insufficient documentation

## 2015-05-25 DIAGNOSIS — Y9389 Activity, other specified: Secondary | ICD-10-CM | POA: Insufficient documentation

## 2015-05-25 DIAGNOSIS — I10 Essential (primary) hypertension: Secondary | ICD-10-CM | POA: Insufficient documentation

## 2015-05-25 DIAGNOSIS — S63616A Unspecified sprain of right little finger, initial encounter: Secondary | ICD-10-CM | POA: Insufficient documentation

## 2015-05-25 MED ORDER — LISINOPRIL 5 MG PO TABS: 5.0000 mg | ORAL_TABLET | Freq: Every day | ORAL | Status: DC

## 2015-05-25 MED ORDER — IBUPROFEN 800 MG PO TABS: 800.0000 mg | ORAL_TABLET | Freq: Three times a day (TID) | ORAL | Status: DC
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
## 2015-05-25 NOTE — Discharge Instructions (Signed)
Finger Sprain A finger sprain is a tear in one of the strong, fibrous tissues that connect the bones (ligaments) in your finger. The severity of the sprain depends on how much of the ligament is torn. The tear can be either partial or complete. CAUSES  Often, sprains are a result of a fall or accident. If you extend your hands to catch an object or to protect yourself, the force of the impact causes the fibers of your ligament to stretch too much. This excess tension causes the fibers of your ligament to tear. SYMPTOMS  You may have some loss of motion in your finger. Other symptoms include:  Bruising.  Tenderness.  Swelling. DIAGNOSIS  In order to diagnose finger sprain, your caregiver will physically examine your finger or thumb to determine how torn the ligament is. Your caregiver may also suggest an X-ray exam of your finger to make sure no bones are broken. TREATMENT  If your ligament is only partially torn, treatment usually involves keeping the finger in a fixed position (immobilization) for a short period. To do this, your caregiver will apply a bandage, cast, or splint to keep your finger from moving until it heals. For a partially torn ligament, the healing process usually takes 2 to 3 weeks. If your ligament is completely torn, you may need surgery to reconnect the ligament to the bone. After surgery a cast or splint will be applied and will need to stay on your finger or thumb for 4 to 6 weeks while your ligament heals. HOME CARE INSTRUCTIONS  Keep your injured finger elevated, when possible, to decrease swelling.  To ease pain and swelling, apply ice to your joint twice a day, for 2 to 3 days:  Put ice in a plastic bag.  Place a towel between your skin and the bag.  Leave the ice on for 15 minutes.  Only take over-the-counter or prescription medicine for pain as directed by your caregiver.  Do not wear rings on your injured finger.  Do not leave your finger unprotected  until pain and stiffness go away (usually 3 to 4 weeks).  Do not allow your cast or splint to get wet. Cover your cast or splint with a plastic bag when you shower or bathe. Do not swim.  Your caregiver may suggest special exercises for you to do during your recovery to prevent or limit permanent stiffness. SEEK IMMEDIATE MEDICAL CARE IF:  Your cast or splint becomes damaged.  Your pain becomes worse rather than better. MAKE SURE YOU:  Understand these instructions.  Will watch your condition.  Will get help right away if you are not doing well or get worse. Document Released: 11/23/2004 Document Revised: 01/08/2012 Document Reviewed: 06/19/2011 [facility] Patient Information 2015 [place], [place]. This information is not intended to replace advice given to you by your health care provider. Make sure you discuss any questions you have with your health care provider.   Emergency Department Resource Guide 1) Find a Doctor and Pay Out of Pocket Although you won't have to find out who is covered by your insurance plan, it is a good idea to ask around and get recommendations. You will then need to call the office and see if the doctor you have chosen will accept you as a new patient and what types of options they offer for patients who are self-pay. Some doctors offer discounts or will set up payment plans for their patients who do not have insurance, but you will need to  ask so you aren't surprised when you get to your appointment.  2) Contact Your Local Health Department Not all health departments have doctors that can see patients for sick visits, but many do, so it is worth a call to see if yours does. If you don't know where your local health department is, you can check in your phone book. The CDC also has a tool to help you locate your state's health department, and many state websites also have listings of all of their local health departments.  3) Find a [facility] If your illness  is not likely to be very severe or complicated, you may want to try a walk in clinic. These are popping up all over the country in pharmacies, drugstores, and shopping centers. They're usually staffed by nurse practitioners or physician assistants that have been trained to treat common illnesses and complaints. They're usually fairly quick and inexpensive. However, if you have serious medical issues or chronic medical problems, these are probably not your best option.  No Primary Care Doctor: - Call Health Connect at  [phone] - they can help you locate a primary care doctor that  accepts your insurance, provides certain services, etc. - Physician Referral Service- [phone]  Chronic Pain Problems: Organization         Address  Phone   Notes  [facility]  [phone] Patients need to be referred by their primary care doctor.   Medication Assistance: Organization         Address  Phone   Notes  [facility] Medication [facility] [address] [place], [place] [address] [phone] --Must be a resident of [facility] -- Must have NO insurance coverage whatsoever (no Medicaid/ Medicare, etc.) -- The pt. MUST have a primary care doctor that directs their care regularly and follows them in the community   MedAssist  [phone]   [employer]  [phone]    Agencies that provide inexpensive medical care: Organization         Address  Phone   Notes  [facility]  [phone]   [facility]    [phone]   [facility] [place], [place] [address] [phone]   [place] [address] [place] [phone]   Planned Parenthood    [phone]   [facility]    [phone]   [place] and [place], [place] Phone:  [phone], Fax:  [phone] Hours of Operation:  9 am - 6  pm, M-F.  Also accepts Medicaid/Medicare and self-pay.  [facility] for [address], [address], [place] Phone: [phone], Fax: [phone]. Hours of Operation:  8:30 am - 5:30 pm, M-F.  Also accepts Medicaid and self-pay.  [facility] [address] [place] Phone: [phone]   [place], [place], [place] [phone], Ext. [phone] Mondays & Thursdays: 7-9 AM.  First 15 patients are seen on a first come, first serve basis.    [place] Providers:  Organization         Address  Phone   Notes  [facility] [address], [place] [phone] Also accepts self-pay patients.  [facility] [address], [place]  [phone])  [place], [address], [place] [phone]   [place] [address] [place] [phone]   Lucianne Lei [address], [place]   [phone] Only accepts [place] Access [place] patients after they have their name applied to their card.   Self-Pay (no insurance) in [facility]:  Organization         Address  Phone   Notes  Sickle Cell Patients, [facility] Internal Medicine [place] [phone]   [facility] [place] [phone]   [facility] [place]  [place], [place], [place] [phone]   [facility]/Dr. Osei-Bonsu  [address] [place] or [address], [place] [phone] Phone number for both [place] and [place] locations is the same.  Urgent Medical and [facility] [address], [place] [phone]   [facility] [address], [place] or [address] [phone]   [facility] [address] [place] [phone], phone; [phone], fax Sees patients 1st and 3rd Saturday of every month.  Must not qualify for public or private insurance (i.e. Medicaid, Medicare, [place] Health Choice, Veterans' Benefits)  Household income should be no more than 200% of the poverty level The clinic cannot treat you if you are pregnant or think you are pregnant  Sexually transmitted diseases are not treated at the clinic.    Dental Care: Organization         Address  Phone  Notes  [facility] Department of [facility] [phone] Accepts children up to age 59 who are enrolled in [place] or [place]; pregnant women with a Medicaid card; and children who have applied for Medicaid or [place] Health Choice, but were declined, whose parents can pay a reduced fee at time of service.  [facility] Department of [facility]  [address], [place] [phone] Accepts children up to age 21 who are enrolled in [place] or [place]; pregnant women with a Medicaid card; and children who have applied for Medicaid or [place] Health Choice, but were declined, whose parents can pay a reduced fee at time of service.  [place] Adult Dental Access PROGRAM  [place] [phone] Patients are seen by appointment only. Walk-ins are not accepted. [place] will see patients 23 years of age and older. Monday - Tuesday (8am-5pm) Most Wednesdays (8:30-5pm) $30 per visit, cash only  [facility] Adult Dental Access PROGRAM  [address], [place] [facility] [phone] Patients are seen by appointment only. Walk-ins are not accepted. [place] will see patients 51 years of age and older. One Wednesday Evening (Monthly: Volunteer Based).  $30 per visit, cash only  [place]  [phone] for adults; Children under age 54, call Graduate Pediatric Dentistry at [phone]. Children aged 26-14, please call [phone] to request a pediatric application.  Dental services are provided in all areas of dental care including fillings, crowns and bridges, complete and partial dentures, implants, gum treatment, root canals, and extractions. Preventive care is also provided. Treatment is provided to both adults and children. Patients are selected via a lottery and there is often  a waiting list.   [facility] [address] [place]  [phone] [email or web address]   [facility] [address] [place], [place] [phone], Ext. [phone] Second and Fourth Thursday of each month, opens at 6:30 AM; Clinic ends at 9 AM.  Patients are seen on a first-come first-served basis, and a limited number are seen during each clinic.   [facility]  [address] Hillard Danker [place], [place] [phone]   Eligibility Requirements You must have lived in [place], [place], or [place] counties for at least the last three months.   You cannot be eligible for state or federal sponsored [employer], including [employer], [place], or [employer].   You generally cannot be eligible for healthcare insurance through your employer.    How to apply: Eligibility screenings are held every Tuesday and Wednesday afternoon from 1:00 pm until 4:00 pm. You do not need an appointment for the interview!  [facility] [address] [place], [place]   [place]  [employer] Department  [place]  [phone]    Behavioral Health Resources in the Community: Intensive Outpatient Programs Organization         Address  Phone  Notes  [place]. [address] [place], [place] [phone]   [facility] Outpatient [address] [place], [place]   ADS: Alcohol & Drug Svcs  [address], [place], [place]   [place] [address],  [place], [place] or [phone]   Substance Abuse Resources Organization         Address  Phone  Notes  Alcohol and Drug Services  [phone]   [place]  [phone]   The [place]   Chinita Pester  [phone]   Residential & Outpatient Substance Abuse Program  [phone]   Psychological Services Organization         Address  Phone  Notes  [facility]  [place]  [phone]   [place] [address] [place] or [phone]    Mobile Crisis Teams Organization         Address  Phone  Notes  [facility], Mobile Crisis Care Unit  [phone]   Assertive Psychotherapeutic Services  [address] [place], [place]   [place] Levels [address], [place] [phone]    Self-Help/Support Groups Organization         Address  Phone             Notes  [place]. of [place] - variety of support groups  [place] Call for more information  Narcotics Anonymous (NA), [facility]aring [facility] [address], [employer] [place]  2 meetings at this location   Special educational needs teacher         Address  Phone  Notes  ASAP Residential Treatment [place],    [place]  [phone]   [facility]  [address] [place] [phone], [place], [place]   [place], [place] [phone] Admissions: 8am-3pm M-F  Incentives Substance [place] [address],    [place], [place]   [facility] [address] [place], [place], [place]   The  [facility] [address],  [place], [place]   [facility] - Intensive Outpatient [address], [place], [place]   [facility] ([place] [address].,  [place], [place] [phone] or [phone]   [facility]idential Treatment Services (RTS) [address] [place], [place] Accepts Medicaid  [facility] [place] [address],  [place] [phone] Substance Abuse/Addiction Treatment   [facility] Organization         Address  Phone  Notes  [facility]  [phone]   Domenic Schwab, PhD [address] Arlis Porta [place], [place]   [phone] or [phone]   [place], [place] [phone]   [facility] [address] [place], [place] [phone] Insurance/Medicaid/sponsorship through [facility] and Families [address] [place]                                    [place], [place] [phone] [place] [address], [place] [phone]    Dr. Adele Schilder  [phone]   [facility] [place] [facility] Dept. 1) [address] [place] 2) [place] 3)  [place] [address], [place] [phone]  [phone]   [place] [phone] or [phone] (After Hours)
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
## 2015-11-13 ENCOUNTER — Emergency Department (HOSPITAL_COMMUNITY)
Admission: EM | Admit: 2015-11-13 | Discharge: 2015-11-14 | Disposition: A | Discharge status: Other Acute Inpatient Hospital | Payer: Self-pay | Attending: Emergency Medicine | Admitting: Emergency Medicine

## 2015-11-13 ENCOUNTER — Encounter (HOSPITAL_COMMUNITY): Payer: Self-pay | Admitting: *Deleted

## 2015-11-13 DIAGNOSIS — F1210 Cannabis abuse, uncomplicated: Secondary | ICD-10-CM | POA: Insufficient documentation

## 2015-11-13 DIAGNOSIS — R51 Headache: Secondary | ICD-10-CM

## 2015-11-13 DIAGNOSIS — Z791 Long term (current) use of non-steroidal anti-inflammatories (NSAID): Secondary | ICD-10-CM | POA: Insufficient documentation

## 2015-11-13 DIAGNOSIS — Z590 Homelessness: Secondary | ICD-10-CM | POA: Insufficient documentation

## 2015-11-13 DIAGNOSIS — R451 Restlessness and agitation: Secondary | ICD-10-CM | POA: Insufficient documentation

## 2015-11-13 DIAGNOSIS — R519 Headache, unspecified: Secondary | ICD-10-CM

## 2015-11-13 DIAGNOSIS — F3130 Bipolar disorder, current episode depressed, mild or moderate severity, unspecified: Secondary | ICD-10-CM | POA: Insufficient documentation

## 2015-11-13 DIAGNOSIS — F319 Bipolar disorder, unspecified: Secondary | ICD-10-CM

## 2015-11-13 DIAGNOSIS — F17200 Nicotine dependence, unspecified, uncomplicated: Secondary | ICD-10-CM | POA: Insufficient documentation

## 2015-11-13 DIAGNOSIS — Z79899 Other long term (current) drug therapy: Secondary | ICD-10-CM | POA: Insufficient documentation

## 2015-11-13 DIAGNOSIS — I10 Essential (primary) hypertension: Secondary | ICD-10-CM

## 2015-11-13 LAB — RAPID URINE DRUG SCREEN, HOSP PERFORMED
Amphetamines: NOT DETECTED
BENZODIAZEPINES: NOT DETECTED
Barbiturates: NOT DETECTED
COCAINE: NOT DETECTED
Opiates: NOT DETECTED
Tetrahydrocannabinol: POSITIVE — AB

## 2015-11-13 LAB — CBC WITH DIFFERENTIAL/PLATELET
BASOS ABS: 0 10*3/uL (ref 0.0–0.1)
BASOS PCT: 0 %
EOS ABS: 0.2 10*3/uL (ref 0.0–0.7)
Eosinophils Relative: 2 %
HCT: 44.6 % (ref 36.0–46.0)
Hemoglobin: 15.5 g/dL — ABNORMAL HIGH (ref 12.0–15.0)
Lymphocytes Relative: 28 %
Lymphs Abs: 3.5 10*3/uL (ref 0.7–4.0)
MCH: 33.1 pg (ref 26.0–34.0)
MCHC: 34.8 g/dL (ref 30.0–36.0)
MCV: 95.3 fL (ref 78.0–100.0)
Monocytes Absolute: 0.4 10*3/uL (ref 0.1–1.0)
Monocytes Relative: 3 %
Neutro Abs: 8.4 10*3/uL — ABNORMAL HIGH (ref 1.7–7.7)
Neutrophils Relative %: 67 %
PLATELETS: 331 10*3/uL (ref 150–400)
RBC: 4.68 MIL/uL (ref 3.87–5.11)
RDW: 13.1 % (ref 11.5–15.5)
WBC: 12.5 10*3/uL — ABNORMAL HIGH (ref 4.0–10.5)

## 2015-11-13 LAB — COMPREHENSIVE METABOLIC PANEL
ALK PHOS: 58 U/L (ref 38–126)
ALT: 14 U/L (ref 14–54)
AST: 17 U/L (ref 15–41)
Albumin: 4.3 g/dL (ref 3.5–5.0)
Anion gap: 8 (ref 5–15)
BILIRUBIN TOTAL: 0.8 mg/dL (ref 0.3–1.2)
BUN: 8 mg/dL (ref 6–20)
CALCIUM: 9.6 mg/dL (ref 8.9–10.3)
CO2: 26 mmol/L (ref 22–32)
CREATININE: 0.83 mg/dL (ref 0.44–1.00)
Chloride: 109 mmol/L (ref 101–111)
GFR calc Af Amer: >60 mL/min (ref >60)
Glucose, Bld: 96 mg/dL (ref 65–99)
POTASSIUM: 4.2 mmol/L (ref 3.5–5.1)
Sodium: 143 mmol/L (ref 135–145)
TOTAL PROTEIN: 7.5 g/dL (ref 6.5–8.1)

## 2015-11-13 LAB — ETHANOL

## 2015-11-13 MED ORDER — DIPHENHYDRAMINE HCL 25 MG PO CAPS
25.0000 mg | ORAL_CAPSULE | Freq: Once | ORAL | Status: AC
  Administered 2015-11-13: 25 mg via ORAL
  Filled 2015-11-13: qty 1

## 2015-11-13 MED ORDER — METOCLOPRAMIDE HCL 10 MG PO TABS
10.0000 mg | ORAL_TABLET | Freq: Once | ORAL | Status: AC
  Administered 2015-11-13: 10 mg via ORAL
  Filled 2015-11-13: qty 1

## 2015-11-13 MED ORDER — ACETAMINOPHEN 500 MG PO TABS
1000.0000 mg | ORAL_TABLET | Freq: Once | ORAL | Status: AC
  Administered 2015-11-13: 1000 mg via ORAL
  Filled 2015-11-13: qty 2

## 2015-11-13 NOTE — ED Notes (Signed)
Pt. Noted sleeping in room. No complaints or concerns voiced. No distress or abnormal behavior noted. Will continue to monitor with security cameras. Q 15 minute rounds continue. 

## 2015-11-13 NOTE — ED Provider Notes (Signed)
CSN: [id number]     Arrival date & time 11/13/15  1150 History   First MD Initiated Contact with Patient 11/13/15 1337     Chief Complaint  Patient presents with  . Medical Clearance  . Depression  . Headache  . Hypertension   HPI Barbara Ellis is a 45 y.o. F PMH significant for HTN, bipolar I disorder presenting for multiple complaints. She states she has been severely depressed for quite some time since she has been homeless for a year, is working, and has children to take care of. She feels tired and unable to take care of it all. She has passive suicidal thoughts. She endorses hypertension, which has been chronically uncontrolled. She also endorses a headache which she describes as 8/10 pain scale, similar to previous headaches she has had in the past with hypertension, pounding, constant. She has had nasal congestion for about a week. She denies HI, hallucinations, fevers, chills, CP, SOB, abdominal pain, N/V/D, worst headache of her life, alleviation attempts.   Past Medical History  Diagnosis Date  . Hypertension   . Bipolar 1 disorder [facility])    Past Surgical History  Procedure Laterality Date  . C section    . Tubal ligation     History reviewed. No pertinent family history. Social History  Substance Use Topics  . Smoking status: Current Every Day Smoker  . Smokeless tobacco: None  . Alcohol Use: No   OB History    No data available     Review of Systems  Ten systems are reviewed and are negative for acute change except as noted in the HPI  Allergies  Sulfa antibiotics  Home Medications   Prior to Admission medications   Medication Sig Start Date End Date Taking? Authorizing Provider  ibuprofen (ADVIL,MOTRIN) 800 MG tablet Take 1 tablet (800 mg total) by mouth 3 (three) times daily. 05/25/15  Yes Domenic Moras, PA-C  lisinopril (PRINIVIL,ZESTRIL) 5 MG tablet Take 1 tablet (5 mg total) by mouth daily. 05/25/15  Yes Domenic Moras, PA-C  traZODone (DESYREL) 100 MG tablet  Take 100 mg by mouth at bedtime.   Yes Historical Provider, MD   BP 173/103 mmHg  Pulse 89  Temp(Src) 98.1 F (36.7 C) (Oral)  Resp 18  SpO2 100%  LMP 10/27/2015 Physical Exam  Constitutional: She is oriented to person, place, and time. She appears well-developed and well-nourished. No distress.  HENT:  Head: Normocephalic and atraumatic.  Right Ear: External ear normal.  Left Ear: External ear normal.  Nose: Nose normal.  Mouth/Throat: Oropharynx is clear and moist. No oropharyngeal exudate.  Eyes: Conjunctivae are normal. Pupils are equal, round, and reactive to light. Right eye exhibits no discharge. Left eye exhibits no discharge. No scleral icterus.  Neck: Normal range of motion. Neck supple. No tracheal deviation present.  Cardiovascular: Normal rate, regular rhythm, normal heart sounds and intact distal pulses.  Exam reveals no gallop and no friction rub.   No murmur heard. Pulmonary/Chest: Effort normal and breath sounds normal. No respiratory distress. She has no wheezes. She has no rales. She exhibits no tenderness.  Abdominal: Soft. Bowel sounds are normal. She exhibits no distension and no mass. There is no tenderness. There is no rebound and no guarding.  Musculoskeletal: She exhibits no edema.  Lymphadenopathy:    She has no cervical adenopathy.  Neurological: She is alert and oriented to person, place, and time. Coordination normal.  Skin: Skin is warm and dry. No rash noted. She is not  diaphoretic. No erythema.  Psychiatric:  Agitated and yelling  Nursing note and vitals reviewed.   ED Course  Procedures  Labs Review Labs Reviewed  CBC WITH DIFFERENTIAL/PLATELET - Abnormal; Notable for the following:    WBC 12.5 (*)    Hemoglobin 15.5 (*)    Neutro Abs 8.4 (*)    All other components within normal limits  URINE RAPID DRUG SCREEN, HOSP PERFORMED - Abnormal; Notable for the following:    Tetrahydrocannabinol POSITIVE (*)    All other components within normal  limits  COMPREHENSIVE METABOLIC PANEL  ETHANOL    MDM   Final diagnoses:  Depression  Headache, unspecified headache type  Essential hypertension   Patient non-toxic appearing. Agitated and yelling about how her head hurts and how high her blood pressure is when I walked through the door to evaluate her. Will give headache cocktail and reassess. Patient is feeling better and less hypertensive upon reevaluation. Most likely primary headache vs hypertensive headache . Less likely venous sinus thrombosis, aneurysmal SAH, AVM, acute CVA, carotid dissection, ICH, SAH, SDH, EDH, postconcussive syndrome, meningitis, encephalitis, abscess, mass effect, hydrocephalus, pseudotumor cerebri, glaucoma, trigeminal neuralgia, sinusitis, TMJ syndrome, temporal arteritis, mastoiditis, carbon monoxide poisoning, rebound HA based on patient history and physical exam.  Nonspecific leukocytosis at 12.5. UDS positive for THC.  Patient is medically cleared for psych hold and can be safely transferred.   [place], PA-C 11/13/15 2025  Forde Dandy, MD 11/14/15 [phone]

## 2015-11-13 NOTE — ED Notes (Addendum)
Pt reports headache since Thursday, thinks it is her blood pressure being high, "severally depressed" has not treated headache. "I've been dealing with homelessness for the last year and I'm just tired" Denies SI/HI

## 2015-11-13 NOTE — ED Notes (Signed)
Up to the bathroom 
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
## 2015-11-13 NOTE — ED Notes (Signed)
Report received from Janie Rambo RN. Pt. Sleeping, respirations regular and unlabored. Will continue to monitor for safety via security cameras and Q 15 minute checks. 

## 2015-11-13 NOTE — ED Notes (Signed)
resting quietly reports ha has improved some

## 2015-11-14 ENCOUNTER — Inpatient Hospital Stay (HOSPITAL_COMMUNITY)
Admission: AD | Admit: 2015-11-14 | Discharge: 2015-11-22 | DRG: 885 | Disposition: A | Discharge status: Home / Self Care | Payer: Federal, State, Local not specified - Other | Source: Intra-hospital | Attending: Psychiatry | Admitting: Psychiatry

## 2015-11-14 ENCOUNTER — Encounter (HOSPITAL_COMMUNITY): Payer: Self-pay | Admitting: *Deleted

## 2015-11-14 DIAGNOSIS — R51 Headache: Secondary | ICD-10-CM

## 2015-11-14 DIAGNOSIS — I10 Essential (primary) hypertension: Secondary | ICD-10-CM | POA: Diagnosis present

## 2015-11-14 DIAGNOSIS — F3130 Bipolar disorder, current episode depressed, mild or moderate severity, unspecified: Principal | ICD-10-CM | POA: Diagnosis present

## 2015-11-14 DIAGNOSIS — R519 Headache, unspecified: Secondary | ICD-10-CM

## 2015-11-14 DIAGNOSIS — R45851 Suicidal ideations: Secondary | ICD-10-CM | POA: Diagnosis present

## 2015-11-14 DIAGNOSIS — Z590 Homelessness: Secondary | ICD-10-CM | POA: Diagnosis not present

## 2015-11-14 DIAGNOSIS — F3131 Bipolar disorder, current episode depressed, mild: Secondary | ICD-10-CM | POA: Diagnosis present

## 2015-11-14 DIAGNOSIS — F17200 Nicotine dependence, unspecified, uncomplicated: Secondary | ICD-10-CM | POA: Diagnosis present

## 2015-11-14 DIAGNOSIS — F319 Bipolar disorder, unspecified: Secondary | ICD-10-CM | POA: Diagnosis present

## 2015-11-14 MED ORDER — CLONIDINE HCL 0.1 MG PO TABS
0.1000 mg | ORAL_TABLET | Freq: Once | ORAL | Status: AC
  Administered 2015-11-14: 0.1 mg via ORAL
  Filled 2015-11-14: qty 1

## 2015-11-14 MED ORDER — MAGNESIUM HYDROXIDE 400 MG/5ML PO SUSP
30.0000 mL | Freq: Every day | ORAL | Status: DC | PRN
  Administered 2015-11-18 – 2015-11-19 (×2): 30 mL via ORAL
  Filled 2015-11-14 (×2): qty 30

## 2015-11-14 MED ORDER — IBUPROFEN 200 MG PO TABS
600.0000 mg | ORAL_TABLET | Freq: Four times a day (QID) | ORAL | Status: DC | PRN
  Administered 2015-11-14: 600 mg via ORAL
  Filled 2015-11-14: qty 3

## 2015-11-14 MED ORDER — IBUPROFEN 600 MG PO TABS
600.0000 mg | ORAL_TABLET | Freq: Four times a day (QID) | ORAL | Status: DC | PRN
  Administered 2015-11-15: 600 mg via ORAL
  Filled 2015-11-14 (×2): qty 1

## 2015-11-14 MED ORDER — FLUOXETINE HCL 20 MG PO CAPS
20.0000 mg | ORAL_CAPSULE | Freq: Every day | ORAL | Status: DC
Start: 2015-11-14 — End: 2015-11-14
  Administered 2015-11-14: 20 mg via ORAL
  Filled 2015-11-14: qty 1

## 2015-11-14 MED ORDER — LORAZEPAM 0.5 MG PO TABS
0.5000 mg | ORAL_TABLET | Freq: Once | ORAL | Status: AC
  Administered 2015-11-14: 0.5 mg via ORAL
  Filled 2015-11-14: qty 1

## 2015-11-14 MED ORDER — TRAZODONE HCL 100 MG PO TABS
100.0000 mg | ORAL_TABLET | Freq: Every day | ORAL | Status: DC
  Administered 2015-11-14: 100 mg via ORAL
  Filled 2015-11-14 (×3): qty 1

## 2015-11-14 MED ORDER — FLUOXETINE HCL 20 MG PO CAPS
20.0000 mg | ORAL_CAPSULE | Freq: Every day | ORAL | Status: DC
  Administered 2015-11-15 – 2015-11-16 (×2): 20 mg via ORAL
  Filled 2015-11-14 (×3): qty 1

## 2015-11-14 MED ORDER — TRAZODONE HCL 100 MG PO TABS: 100.0000 mg | ORAL_TABLET | Freq: Every day | ORAL | Status: DC

## 2015-11-14 MED ORDER — OXCARBAZEPINE 300 MG PO TABS
300.0000 mg | ORAL_TABLET | Freq: Two times a day (BID) | ORAL | Status: DC
  Administered 2015-11-14: 300 mg via ORAL
  Filled 2015-11-14: qty 1

## 2015-11-14 MED ORDER — ENSURE ENLIVE PO LIQD
237.0000 mL | Freq: Two times a day (BID) | ORAL | Status: DC
  Administered 2015-11-19 – 2015-11-21 (×3): 237 mL via ORAL

## 2015-11-14 MED ORDER — NICOTINE 21 MG/24HR TD PT24
21.0000 mg | MEDICATED_PATCH | Freq: Every day | TRANSDERMAL | Status: DC
  Administered 2015-11-15 – 2015-11-22 (×8): 21 mg via TRANSDERMAL
  Filled 2015-11-14 (×11): qty 1

## 2015-11-14 MED ORDER — LISINOPRIL 5 MG PO TABS
5.0000 mg | ORAL_TABLET | Freq: Every day | ORAL | Status: DC
  Administered 2015-11-14: 5 mg via ORAL
  Filled 2015-11-14: qty 1

## 2015-11-14 MED ORDER — ACETAMINOPHEN 325 MG PO TABS: 650.0000 mg | ORAL_TABLET | Freq: Four times a day (QID) | ORAL | Status: DC | PRN

## 2015-11-14 MED ORDER — LISINOPRIL 5 MG PO TABS
5.0000 mg | ORAL_TABLET | Freq: Every day | ORAL | Status: DC
  Administered 2015-11-15 – 2015-11-17 (×3): 5 mg via ORAL
  Filled 2015-11-14 (×5): qty 1

## 2015-11-14 MED ORDER — OXCARBAZEPINE 300 MG PO TABS
300.0000 mg | ORAL_TABLET | Freq: Two times a day (BID) | ORAL | Status: DC
  Administered 2015-11-14 – 2015-11-16 (×4): 300 mg via ORAL
  Filled 2015-11-14 (×7): qty 1

## 2015-11-14 MED ORDER — ALUM & MAG HYDROXIDE-SIMETH 200-200-20 MG/5ML PO SUSP
30.0000 mL | ORAL | Status: DC | PRN
  Administered 2015-11-15: 30 mL via ORAL
  Filled 2015-11-14: qty 30

## 2015-11-14 MED ORDER — CLONIDINE HCL 0.1 MG PO TABS: 0.1000 mg | ORAL_TABLET | Freq: Once | ORAL | Status: DC

## 2015-11-14 NOTE — Progress Notes (Signed)
Attempts x 2 to reach [facility] for report. No answer in SAPPU. Jamie Kato

## 2015-11-14 NOTE — ED Notes (Signed)
Up tot he bathroom to shower and change scrubs 

## 2015-11-14 NOTE — ED Notes (Signed)
Dr A and Jamison DNP into see 

## 2015-11-14 NOTE — Progress Notes (Signed)
Patient did attend the evening speaker AA meeting.  

## 2015-11-14 NOTE — ED Notes (Signed)
Up to the bathroom 

## 2015-11-14 NOTE — Progress Notes (Signed)
D.  Pt is a new admission to unit, met with her in room with her sister present.  Pt wanted to be sure her sister, [name], has access to her treatment team and to speaking with her care providers about her.  Pt states sister is very involved with her care and is her main support.  Pt was pleased to learn that treatment team meets tomorrow and sister states she will call after 10 AM to speak to the doctor or an available member of the team regarding plans for her sister's treatment and care.  Pt endorses passive SI but does contract for safety on the unit.  Pt would like to attend group on 400 hall as she is not here for substance abuse.   She did agree to attend AA tonight however.  A.  Support and encouragement offered, made sure that sister was on the proper consent sheet to be able to speak to staff as Pt has requested.  Spoke to [facility] to find out best time for her to call tomorrow to learn about plan of care.  Medication given as ordered.  R.  Pt remains safe on the unit, will continue to monitor.

## 2015-11-14 NOTE — ED Notes (Signed)
Pt. Noted sleeping in room. No complaints or concerns voiced. No distress or abnormal behavior noted. Will continue to monitor with security cameras. Q 15 minute rounds continue. 

## 2015-11-14 NOTE — Progress Notes (Signed)
This Probation officer was informed by Frankey Poot, Disposition CSW patient is still assigned to bed 300-1 and per Dione Housekeeper, [facility] she is able to move to [facility] now.  Dr. Darleene Cleaver and Theodoro Clock, NP agrees with transfer.  Call report to [phone].    Chesley Noon, MSW, Darlyn Read [facility] Triage Specialist [phone]

## 2015-11-14 NOTE — Tx Team (Addendum)
Initial Interdisciplinary Treatment Plan   PATIENT STRESSORS: Financial difficulties Loss of parents, housing Medication change or noncompliance Occupational concerns   PATIENT STRENGTHS: Ability for insight Average or above average intelligence Communication skills General fund of knowledge Motivation for treatment/growth Physical Health Supportive family/friends Work skills   PROBLEM LIST: Problem List/Patient Goals Date to be addressed Date deferred Reason deferred Estimated date of resolution  "I need help with money management skills and I need a roof over my head." 11/14/15           "I need to get back on my meds." 11/14/15     Suicidal Ideation 11/14/15     Depression 11/14/15                              DISCHARGE CRITERIA:  Improved stabilization in mood, thinking, and/or behavior Need for constant or close observation no longer present Reduction of life-threatening or endangering symptoms to within safe limits Verbal commitment to aftercare and medication compliance  PRELIMINARY DISCHARGE PLAN: Attend aftercare/continuing care group Outpatient therapy Placement in alternative living arrangements  PATIENT/FAMIILY INVOLVEMENT: This treatment plan has been presented to and reviewed with the patient, Barbara Ellis, and/or family member.  The patient and family have been given the opportunity to ask questions and make suggestions.  Loletta Specter [facility] 11/14/2015, 4:34 PM

## 2015-11-14 NOTE — ED Notes (Signed)
[facility] contacted for transport, [employer] updated.

## 2015-11-14 NOTE — ED Notes (Signed)
Pt reports that the HA is constant but she feels periods of tingling/numbess in her head about every 10 mins and then she has  Hot flash.  Will inform md/np
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
## 2015-11-14 NOTE — ED Notes (Signed)
Pt ambulatory w/o difficulty to [facility] w/ [facility], belongings given to driver. 

## 2015-11-14 NOTE — ED Notes (Signed)
Barbara Ellis is here to transport, belongings given to driver. 

## 2015-11-14 NOTE — ED Notes (Signed)
[facility] to call back for report 

## 2015-11-14 NOTE — ED Notes (Signed)
Pt is aware that she will be transferred to [facility] for admission.  Pt also concerned about her car parked in the hospital lot, security notified.

## 2015-11-15 ENCOUNTER — Encounter (HOSPITAL_COMMUNITY): Payer: Self-pay | Admitting: Psychiatry

## 2015-11-15 DIAGNOSIS — F3130 Bipolar disorder, current episode depressed, mild or moderate severity, unspecified: Principal | ICD-10-CM

## 2015-11-15 MED ORDER — BUTALBITAL-APAP-CAFFEINE 50-325-40 MG PO TABS
2.0000 | ORAL_TABLET | ORAL | Status: DC | PRN
  Administered 2015-11-15 – 2015-11-22 (×5): 2 via ORAL
  Filled 2015-11-15 (×5): qty 2

## 2015-11-15 MED ORDER — MIRTAZAPINE 15 MG PO TABS
15.0000 mg | ORAL_TABLET | Freq: Every day | ORAL | Status: DC
  Administered 2015-11-15: 15 mg via ORAL
  Filled 2015-11-15 (×2): qty 1

## 2015-11-15 MED ORDER — HYDROXYZINE HCL 25 MG PO TABS
25.0000 mg | ORAL_TABLET | Freq: Four times a day (QID) | ORAL | Status: DC | PRN
  Administered 2015-11-15 – 2015-11-22 (×6): 25 mg via ORAL
  Filled 2015-11-15 (×4): qty 1
  Filled 2015-11-15: qty 10
  Filled 2015-11-15 (×2): qty 1

## 2015-11-15 MED ORDER — GABAPENTIN 100 MG PO CAPS
100.0000 mg | ORAL_CAPSULE | Freq: Three times a day (TID) | ORAL | Status: DC
Start: 2015-11-15 — End: 2015-11-22
  Administered 2015-11-15 – 2015-11-22 (×21): 100 mg via ORAL
  Filled 2015-11-15 (×5): qty 1
  Filled 2015-11-15: qty 21
  Filled 2015-11-15 (×6): qty 1
  Filled 2015-11-15: qty 21
  Filled 2015-11-15 (×7): qty 1
  Filled 2015-11-15: qty 21
  Filled 2015-11-15 (×9): qty 1

## 2015-11-15 MED ORDER — PANTOPRAZOLE SODIUM 40 MG PO TBEC
40.0000 mg | DELAYED_RELEASE_TABLET | Freq: Every day | ORAL | Status: DC
  Administered 2015-11-15 – 2015-11-22 (×8): 40 mg via ORAL
  Filled 2015-11-15 (×9): qty 1
  Filled 2015-11-15: qty 7
  Filled 2015-11-15 (×2): qty 1

## 2015-11-15 NOTE — Progress Notes (Addendum)
Patient ID: Barbara Ellis, female   DOB: [birth year], 45 y.o.   MRN: [id number]  DAR: Pt. Denies HI and A/V Hallucinations. She reports passive SI but is able to contract for safety. She reports sleep is fair, appetite is fair, energy level is low, and concentration is poor. She rates depression 7/10, hopelessness 10/10, and anxiety is 5/10. Patient reports a headache that she states, "has been going on for a couple of days." She is given Ibuprofen but no relief is provided. MD Sabra Heck is made aware and patient received order for Fioricet which provided relief. She also reported some dizziness and anxiety. Patient was instructed to follow safety precautions to prevent falls and Vistaril which was helpful. Support and encouragement provided to the patient. Writer spoke 1:1 with patient about her anxiety, agitation, and anger. Scheduled medications administered to patient per physician's order. Patient's BP has remained elevated, MD Denmark notified of this. At this time no new orders placed. Patient reports dizziness has subsided and reports no symptoms related to high BP. Patient is receptive and cooperative. She is seen in the milieu and is interacting with her peers appropriately. Writer encouraged patient to come to her with any questions or concerns. Q15 minute checks are maintained for safety.
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
## 2015-11-15 NOTE — Progress Notes (Signed)
Adult Psychoeducational Group Note  Date:  11/15/2015 Time:  8:55 PM  Group Topic/Focus:  Wrap-Up Group:   The focus of this group is to help patients review their daily goal of treatment and discuss progress on daily workbooks.  Participation Level:  Active  Participation Quality:  Appropriate and Attentive  Affect:  Appropriate  Cognitive:  Appropriate  Insight: Appropriate  Engagement in Group:  Engaged  Modes of Intervention:  Discussion  Additional Comments:  Pt stated her day was good. Her goal was to not cry all day. Pt stated she only cried about half the day. Pt stated she also wanted to work on staying positive which she was able to do.  Clint Bolder 11/15/2015, 8:55 PM

## 2015-11-15 NOTE — Progress Notes (Signed)
Recreation Therapy Notes  Date: 01.16.2017 Time: 9:30am Location: 300 Hall Group Room  Group Topic: Stress Management  Goal Area(s) Addresses:  Patient will actively participate in stress management techniques presented during session.   Behavioral Response: Did not attend.   Laureen Ochs Charisma Charlot, LRT/CTRS        Daenerys Buttram L 11/15/2015 12:01 PM

## 2015-11-15 NOTE — Progress Notes (Signed)
NUTRITION ASSESSMENT  Pt identified as at risk on the Malnutrition Screen Tool  INTERVENTION: 1. Educated patient on the importance of nutrition and encouraged intake of food and beverages. 2. Discussed weight goals. 3. Supplements: continue Ensure Enlive BID, each supplement provides 350 kcal and 20 grams of protein  NUTRITION DIAGNOSIS: Unintentional weight loss related to sub-optimal intake as evidenced by pt report.   Goal: Pt to meet >/= 90% of their estimated nutrition needs.  Monitor:  PO intake  Assessment:  Pt seen for MST. Pt admitted with severe depression. She states that she has been homeless x1 year. Per review, pt has lost 7 lbs (3% body weight) in the past 10 months which is not significant for time frame.  Ensure Jeanne Ivan has already been ordered BID.  45 y.o. female  Height: Ht Readings from Last 1 Encounters:  11/14/15 5' 6.5" (1.689 m)    Weight: Wt Readings from Last 1 Encounters:  11/14/15 218 lb (98.884 kg)    Weight Hx: Wt Readings from Last 10 Encounters:  11/14/15 218 lb (98.884 kg)  01/12/15 225 lb (102.059 kg)  07/13/12 210 lb (95.255 kg)    BMI:  Body mass index is 34.66 kg/(m^2). Pt meets criteria for obesity based on current BMI.  Estimated Nutritional Needs: Kcal: 25-30 kcal/kg Protein: > 1 gram protein/kg Fluid: 1 ml/kcal  Diet Order: Diet Heart Room service appropriate?: Yes; Fluid consistency:: Thin Pt is also offered choice of unit snacks mid-morning and mid-afternoon.  Pt is eating as desired.   Lab results and medications reviewed.      Jarome Matin, RD, LDN Inpatient Clinical Dietitian Pager # [phone] After hours/weekend pager # [phone]

## 2015-11-15 NOTE — BHH Suicide Risk Assessment (Signed)
[facility] INPATIENT:  Family/Significant Other Suicide Prevention Education  Suicide Prevention Education:  Contact Attempts: Celso Sickle (pt's sister) [phone] has been identified by the patient as the family member/significant other with whom the patient will be residing, and identified as the person(s) who will aid the patient in the event of a mental health crisis.  With written consent from the patient, two attempts were made to provide suicide prevention education, prior to and/or following the patient's discharge.  We were unsuccessful in providing suicide prevention education.  A suicide education pamphlet was given to the patient to share with family/significant other.  Date and time of first attempt: 3:13PM on 11/15/15--voicemail left requesting call back at her earliest convenience.   Smart, Renetta Suman LCSW 11/15/2015, 3:13 PM   Celso Sickle (pt's sister) called. SPE completed. Discharge plan discussed with pt consent.   Maxie Better, MSW, LCSW Clinical Social Worker 11/15/2015 4:00 PM

## 2015-11-15 NOTE — Progress Notes (Signed)
CSW attempted to make contact with Allyson from [facility] program on pt behalf. Allyson's voicemail full-will try again in the morning.  Maxie Better, MSW, LCSW Clinical Social Worker 11/15/2015 4:11 PM

## 2015-11-15 NOTE — Plan of Care (Signed)
Problem: Diagnosis: Increased Risk For Suicide Attempt Goal: STG-Patient Will Comply With Medication Regime Outcome: Progressing Barbara Ellis is compliant with her medication and requested education about Neurontin which shows investment in her treatment.
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
## 2015-11-15 NOTE — BHH Group Notes (Signed)
[facility] LCSW Group Therapy  11/15/2015 1:07 PM  Type of Therapy:  Group Therapy  Participation Level:  Active  Participation Quality:  Attentive  Affect:  Appropriate  Cognitive:  Alert and Oriented  Insight:  Improving  Engagement in Therapy:  Improving  Modes of Intervention:  Confrontation, Discussion, Education, Exploration, Problem-solving, Rapport Building, Socialization and Support  Summary of Progress/Problems: Today's Topic: Overcoming Obstacles. Patients identified one short term goal and potential obstacles in reaching this goal. Patients processed barriers involved in overcoming these obstacles. Patients identified steps necessary for overcoming these obstacles and explored motivation (internal and external) for facing these difficulties head on. Zyrah was attentive and engaged during today's processing group. She shared that her biggest obstacle was managing her bipolar sx "my mood swings, especially anger, get so out of control." Shatonya stated that past medications did not help with managing symptoms. "I'm now homeless and struggling to keep my job because I can't properly manage money or my mood problems." CSW talked with Pt about getting a payee to help her manage money, working with the MD on staff to find an effective medication regimen and worked on connecting pt to [facility] and [employer] for med management, counseling, and group therapy. Pt was receptive to information provided and appears to be motivated for treatment on an outpatient basis.   Smart, Roarke Marciano LCSW 11/15/2015, 1:07 PM
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
## 2015-11-15 NOTE — BHH Group Notes (Signed)
[facility] LCSW Aftercare Discharge Planning Group Note   11/15/2015 10:09 AM  Participation Quality:  Minimal   Mood/Affect:  Appropriate  Depression Rating:  7  Anxiety Rating:  7  Thoughts of Suicide:  No Will you contract for safety?   NA  Current AVH:  No  Plan for Discharge/Comments:  Pt reports that she is homeless and living in her car. Pt struggles with bipolar symptoms and reports med noncompliance for 32mo. Pt reports limited family supports. Pt reports poor sleep.   Transportation Means: pt has car in [employer] parking lot.   Supports: sister  Smart, Nira Conn LCSW

## 2015-11-15 NOTE — Plan of Care (Signed)
Problem: Diagnosis: Increased Risk For Suicide Attempt Goal: STG-Patient Will Attend All Groups On The Unit Outcome: Progressing Pt did attend evening AA group     

## 2015-11-16 ENCOUNTER — Inpatient Hospital Stay (HOSPITAL_COMMUNITY): Payer: Federal, State, Local not specified - Other

## 2015-11-16 MED ORDER — OXCARBAZEPINE 150 MG PO TABS
150.0000 mg | ORAL_TABLET | Freq: Two times a day (BID) | ORAL | Status: DC
  Administered 2015-11-16 – 2015-11-22 (×12): 150 mg via ORAL
  Filled 2015-11-16: qty 1
  Filled 2015-11-16: qty 14
  Filled 2015-11-16 (×4): qty 1
  Filled 2015-11-16: qty 14
  Filled 2015-11-16 (×9): qty 1

## 2015-11-16 MED ORDER — ZOLPIDEM TARTRATE 5 MG PO TABS
5.0000 mg | ORAL_TABLET | Freq: Every evening | ORAL | Status: DC | PRN
  Administered 2015-11-17 – 2015-11-20 (×4): 5 mg via ORAL
  Filled 2015-11-16 (×4): qty 1

## 2015-11-16 MED ORDER — ZIPRASIDONE HCL 20 MG PO CAPS
20.0000 mg | ORAL_CAPSULE | Freq: Two times a day (BID) | ORAL | Status: DC
  Administered 2015-11-16 – 2015-11-19 (×7): 20 mg via ORAL
  Filled 2015-11-16 (×12): qty 1

## 2015-11-16 MED ORDER — CLONIDINE HCL 0.1 MG PO TABS
0.1000 mg | ORAL_TABLET | Freq: Two times a day (BID) | ORAL | Status: DC
  Administered 2015-11-16 – 2015-11-22 (×12): 0.1 mg via ORAL
  Filled 2015-11-16 (×9): qty 1
  Filled 2015-11-16: qty 14
  Filled 2015-11-16 (×6): qty 1
  Filled 2015-11-16: qty 14

## 2015-11-16 MED ORDER — GADOBENATE DIMEGLUMINE 529 MG/ML IV SOLN
20.0000 mL | Freq: Once | INTRAVENOUS | Status: AC | PRN
  Administered 2015-11-16: 20 mL via INTRAVENOUS
  Filled 2015-11-16: qty 20

## 2015-11-16 NOTE — Progress Notes (Signed)
Barbara Ellis from [employer] at [facility] coming to visit with pt today.  Maxie Better, MSW, LCSW Clinical Social Worker 11/16/2015 11:18 AM

## 2015-11-16 NOTE — BHH Group Notes (Signed)

## 2015-11-16 NOTE — BHH Group Notes (Signed)
Pt did not attend wrap up group.   Victorino Sparrow, MHT

## 2015-11-16 NOTE — Progress Notes (Signed)
Patient ID: Barbara Ellis, female   DOB: 12-16-1970, 45 y.o.   MRN: [id number]  DAR: Pt. Denies HI and A/V Hallucinations. She reports passive SI but is able to contract for safety. She reports sleep was poor, appetite is poor, energy level is low, and concentration is poor. She rates depression 6/10, hopelessness 10/10, and anxiety 10/10. PRN Vistaril given for anxiety which provided some relief. Patient does report an ongoing headache and received Fioricet which provided some relief. Support and encouragement provided to the patient. Scheduled medications administered to patient per physician's orders. Patient was taken to [facility] for an MRI via [facility]. Patient returned in no distress. During group this morning she expressed feeling as though she does not have a choice when it comes to "smoking weed." She reports she feels stuck in her current situation. An [employer] representative came to speak with patient about her options and patient reported the meeting went well. She is seen in the milieu interacting with her peers and is attending groups. Patient's BP remains elevated. MD Sabra Heck is aware. Patient's sister [name] called to speak about patient's plan of care and her blood pressure. Q15 minute checks are maintained for safety.

## 2015-11-16 NOTE — Progress Notes (Signed)
[facility] MD Progress Note  11/16/2015 7:24 PM Barbara Ellis  MRN:  [id number] Subjective:  States she is having a lot of mood fluctuations. She went to bed, woke up trough the night when finally up she started feeling irritated angry. States she is not like that. She also describes episodes suggesting dissociation. States this is new for her. She has continue to experience the headache. Does admit she has had episodes of double vision.  Principal Problem: Bipolar I disorder, most recent episode depressed ([place]) Diagnosis:   Patient Active Problem List   Diagnosis Date Noted  . Bipolar I disorder, most recent episode depressed ([place]) [F31.30] 11/15/2015   Total Time spent with patient: 30 minutes  Past Psychiatric History: see admission H and P  Past Medical History:  Past Medical History  Diagnosis Date  . Hypertension   . Bipolar 1 disorder [facility])     Past Surgical History  Procedure Laterality Date  . C section    . Tubal ligation     Family History: History reviewed. No pertinent family history. Family Psychiatric  History: see admission H and P Social History:  History  Alcohol Use No     History  Drug Use  . Yes  . Special: Marijuana    Social History   Social History  . Marital Status: Divorced    Spouse Name: N/A  . Number of Children: N/A  . Years of Education: N/A   Social History Main Topics  . Smoking status: Current Every Day Smoker  . Smokeless tobacco: None  . Alcohol Use: No  . Drug Use: Yes    Special: Marijuana  . Sexual Activity: Not Asked   Other Topics Concern  . None   Social History Narrative   Additional Social History:                         Sleep: Fair  Appetite:  Fair  Current Medications: Current Facility-Administered Medications  Medication Dose Route Frequency Provider Last Rate Last Dose  . acetaminophen (TYLENOL) tablet 650 mg  650 mg Oral Q6H PRN Patrecia Pour, NP      . alum & mag hydroxide-simeth  (MAALOX/MYLANTA) 200-200-20 MG/5ML suspension 30 mL  30 mL Oral Q4H PRN Patrecia Pour, NP   30 mL at 11/15/15 1838  . butalbital-acetaminophen-caffeine (FIORICET, ESGIC) 50-325-40 MG per tablet 2 tablet  2 tablet Oral Q4H PRN Nicholaus Bloom, MD   2 tablet at 11/16/15 0800  . cloNIDine (CATAPRES) tablet 0.1 mg  0.1 mg Oral BID Nicholaus Bloom, MD   0.1 mg at 11/16/15 1723  . feeding supplement (ENSURE ENLIVE) (ENSURE ENLIVE) liquid 237 mL  237 mL Oral BID BM Nicholaus Bloom, MD   237 mL at 11/15/15 1000  . gabapentin (NEURONTIN) capsule 100 mg  100 mg Oral TID Nicholaus Bloom, MD   100 mg at 11/16/15 1723  . hydrOXYzine (ATARAX/VISTARIL) tablet 25 mg  25 mg Oral Q6H PRN Nicholaus Bloom, MD   25 mg at 11/16/15 0800  . ibuprofen (ADVIL,MOTRIN) tablet 600 mg  600 mg Oral Q6H PRN Patrecia Pour, NP   600 mg at 11/15/15 [id number]  . lisinopril (PRINIVIL,ZESTRIL) tablet 5 mg  5 mg Oral Daily Patrecia Pour, NP   5 mg at 11/16/15 0800  . magnesium hydroxide (MILK OF MAGNESIA) suspension 30 mL  30 mL Oral Daily PRN Patrecia Pour, NP      .  nicotine (NICODERM CQ - dosed in mg/24 hours) patch 21 mg  21 mg Transdermal Daily Nicholaus Bloom, MD   21 mg at 11/16/15 0800  . OXcarbazepine (TRILEPTAL) tablet 150 mg  150 mg Oral BID Nicholaus Bloom, MD   150 mg at 11/16/15 1723  . pantoprazole (PROTONIX) EC tablet 40 mg  40 mg Oral Daily Nicholaus Bloom, MD   40 mg at 11/16/15 0800  . ziprasidone (GEODON) capsule 20 mg  20 mg Oral BID WC Nicholaus Bloom, MD   20 mg at 11/16/15 1723  . zolpidem (AMBIEN) tablet 5 mg  5 mg Oral QHS PRN Nicholaus Bloom, MD        Lab Results: No results found for this or any previous visit (from the past 48 hour(s)).  Physical Findings: AIMS: Facial and Oral Movements Muscles of Facial Expression: None, normal Lips and Perioral Area: None, normal Jaw: None, normal Tongue: None, normal,Extremity Movements Upper (arms, wrists, hands, fingers): None, normal Lower (legs, knees, ankles, toes): None,  normal, Trunk Movements Neck, shoulders, hips: None, normal, Overall Severity Severity of abnormal movements (highest score from questions above): None, normal Incapacitation due to abnormal movements: None, normal Patient's awareness of abnormal movements (rate only patient's report): No Awareness, Dental Status Current problems with teeth and/or dentures?: No Does patient usually wear dentures?: No  CIWA:    COWS:     Musculoskeletal: Strength & Muscle Tone: within normal limits Gait & Station: normal Patient leans: normal  Psychiatric Specialty Exam: Review of Systems  Constitutional: Negative.   Eyes: Negative.   Respiratory: Negative.   Cardiovascular: Negative.   Gastrointestinal: Negative.   Genitourinary: Negative.   Musculoskeletal: Negative.   Skin: Negative.   Neurological: Positive for headaches.  Endo/Heme/Allergies: Negative.   Psychiatric/Behavioral: Positive for depression. The patient is nervous/anxious.     Blood pressure 140/89, pulse 98, temperature 98.2 F (36.8 C), temperature source Oral, resp. rate 16, height 5' 6.5" (1.689 m), weight 98.884 kg (218 lb), last menstrual period 10/27/2015.Body mass index is 34.66 kg/(m^2).  General Appearance: Fairly Groomed  Engineer, water::  Fair  Speech:  Clear and Coherent  Volume:  fluctuates  Mood:  Dysphoric and Irritable  Affect:  Labile  Thought Process:  Coherent and Goal Directed  Orientation:  Full (Time, Place, and Person)  Thought Content:  symptoms events worries concerns  Suicidal Thoughts:  No  Homicidal Thoughts:  No  Memory:  Immediate;   Fair Recent;   Fair Remote;   Fair  Judgement:  Fair  Insight:  Present and Shallow  Psychomotor Activity:  Restlessness  Concentration:  Fair  Recall:  [employer] of Knowledge:Fair  Language: Fair  Akathisia:  No  Handed:  Right  AIMS (if indicated):     Assets:  Desire for Improvement  ADL's:  Intact  Cognition: WNL  Sleep:  Number of Hours: 6.75    Treatment Plan Summary: Daily contact with patient to assess and evaluate symptoms and progress in treatment and Medication management Supportive approach/coping skills Mood instability; will go ahead and D/C the Prozac to R/O the possibility is producing the irritability, anger Will also decrease the Trileptal to 150 mg BID ( she has been of it for a while, starting the 300 mg could have been too much) Will start a trial with Geodon 20 mg BID as a mood stabilizer that could augment the Trileptal High BP; will use clonidine 0.1 mg BID and reassess With the presence of  HA, dissociative symptoms. episodes of double vision, family history of brain tumors will order a Head CT SCAN/Brain MRI Will work with CBT/mindfulness Emmerson Taddei A 11/16/2015, 7:24 PM

## 2015-11-16 NOTE — BHH Group Notes (Signed)
[place] LCSW Group Therapy  11/16/2015 1:04 PM  Type of Therapy:  Group Therapy  Participation Level:  Active  Participation Quality:  Attentive  Affect:  Appropriate  Cognitive:  Alert and Oriented  Insight:  Improving  Engagement in Therapy:  Improving  Modes of Intervention:  Discussion, Education, Exploration, Problem-solving, Rapport Building, Socialization and Support  Summary of Progress/Problems: MHA Speaker came to talk about his personal journey with substance abuse and addiction. The pt processed ways by which to relate to the speaker. [place] speaker provided handouts and educational information pertaining to groups and services offered by the [facility].   Smart, Gudelia Eugene LCSW 11/16/2015, 1:04 PM

## 2015-11-16 NOTE — Plan of Care (Signed)
Problem: Alteration in mood Goal: STG-Patient reports thoughts of self-harm to staff Outcome: Progressing Pt reports that she is still having passive suicidal thoughts, but that she will come to staff if she is not able to control them and feels unsafe.  She has a roommate on the unit with which she is getting along and feels comfortable being around her.

## 2015-11-16 NOTE — Progress Notes (Signed)
Recreation Therapy Notes  Animal-Assisted Activity (AAA) Program Checklist/Progress Notes Patient Eligibility Criteria Checklist & Daily Group note for Rec Tx Intervention  Date: 01.17.2017 Time: 2:45pm Location: [address] Dayroom    AAA/T Program Assumption of Risk Form signed by Patient/ or Parent Legal Guardian yes  Patient is free of allergies or sever asthma yes  Patient reports no fear of animals yes  Patient reports no history of cruelty to animals yes  Patient understands his/her participation is voluntary yes  Behavioral Response: Did not attend   Markelle Asaro L Marlon Suleiman, LRT/CTRS  Christa Fasig L 11/16/2015 2:50 PM 

## 2015-11-16 NOTE — Plan of Care (Signed)
Problem: Diagnosis: Increased Risk For Suicide Attempt Goal: STG-Patient Will Report Suicidal Feelings to Staff Outcome: Progressing Seniah is able to report suicidal thoughts to staff but is able to contract for safety.

## 2015-11-16 NOTE — Progress Notes (Signed)
Pt reports that her day started out rough, but it has ended as a fairly good day.  She had a visitor tonight.  She says that her headache finally went away.  She denies HI/AVH, but states she still has passive suicidal thoughts.  She contracts for Probation officer.  Pt states that she attended some groups after she felt better.  She did say that she felt nauseous after dinner, but that subsided also.  Pt has been observed in the dayroom since the beginning of the shift and has been pleasant and cooperative with staff and peers.  Her BP continues to be elevated.  Providers are aware.  Pt makes her needs known to staff.  Pt is unsure of her discharge plans at this time.  Pt voices no needs or concerns.  Support and encouragement offered.  Discharge plans are in process.  Safety maintained with q15 minute checks.

## 2015-11-17 LAB — LIPID PANEL
CHOL/HDL RATIO: 5.1 ratio
Cholesterol: 198 mg/dL (ref 0–200)
HDL: 39 mg/dL — AB (ref >40)
LDL CALC: 135 mg/dL — AB (ref 0–99)
Triglycerides: 119 mg/dL (ref <150)
VLDL: 24 mg/dL (ref 0–40)

## 2015-11-17 LAB — TSH: TSH: 0.83 u[IU]/mL (ref 0.350–4.500)

## 2015-11-17 MED ORDER — LISINOPRIL 10 MG PO TABS
10.0000 mg | ORAL_TABLET | Freq: Every day | ORAL | Status: DC
  Administered 2015-11-18 – 2015-11-22 (×5): 10 mg via ORAL
  Filled 2015-11-17 (×2): qty 1
  Filled 2015-11-17: qty 2
  Filled 2015-11-17 (×4): qty 1
  Filled 2015-11-17: qty 7

## 2015-11-17 NOTE — Progress Notes (Signed)
Patient ID: Barbara Ellis, female   DOB: 01/10/1971, 45 y.o.   MRN: [id number] D: Client visible on the unit, interacts appropriately with staff and peers. Client reports "day pretty good" "mediction adjusting" "I work hard but don't know what happens to my money" Client talks about medical issues "he said I have a spot on my brain, I don't know what that mean, so my sister is going to go with me to find out" "I have lost chunks of time" "I have increased sex drive since then" "I been living in my car" "my mom died last year and my sister sold the house, got married" A: Probation officer provided emotional support, encouraged client to follow up with physician for full details about her health. Reviewed medications, administered as ordered. Staff will monitor q38min for safety. R: Client is safe on the unit, attended group.

## 2015-11-17 NOTE — Plan of Care (Signed)
Problem: Ineffective individual coping Goal: STG: Patient will remain free from self harm Outcome: Progressing She remains free from self harm.

## 2015-11-17 NOTE — Progress Notes (Deleted)
Recreation Therapy Notes  Date: 01.18.2017 Time: 9:30am Location: [address] Group Room   Group Topic: Stress Management  Goal Area(s) Addresses:  Patient will actively participate in stress management techniques presented during session.   Behavioral Response: Did not attend.   Laureen Ochs Tanyiah Laurich, LRT/CTRS  Lane Hacker 11/17/2015 11:37 AM

## 2015-11-17 NOTE — Progress Notes (Signed)
Per pt request, CSW contacted [employer] (temp agency) to nofity them that pt is currently hospitalized. Pt attempted to contact pt's second employer ([employer])--no answer. Pt to call her supervisor Gerald Stabs [phone] this afternoon when he is available. CSW left message with Glennis Brink from [facility] program through the [facility] to touch base with her regarding yesterday's meeting with pt. [phone]). CSW requested call back ASAP.   Maxie Better, MSW, LCSW Clinical Social Worker 11/17/2015 11:02 AM

## 2015-11-17 NOTE — Progress Notes (Signed)
[facility] MD Progress Note  11/17/2015 3:56 PM Barbara Ellis  MRN:  [id number] Subjective:  States she is having some difficulties with concentration that seems to have been getting better. She continues to have the headache but is also better. She felt groggy yesterday but that is getting better. She has cried only once today. She has not been as irritated and angry as she was yesterday.  Principal Problem: Bipolar I disorder, most recent episode depressed ([place]) Diagnosis:   Patient Active Problem List   Diagnosis Date Noted  . Bipolar I disorder, most recent episode depressed ([place]) [F31.30] 11/15/2015   Total Time spent with patient: 30 minutes  Past Psychiatric History: see admission H and P  Past Medical History:  Past Medical History  Diagnosis Date  . Hypertension   . Bipolar 1 disorder [facility])     Past Surgical History  Procedure Laterality Date  . C section    . Tubal ligation     Family History: History reviewed. No pertinent family history. Family Psychiatric  History: see admission H and P Social History:  History  Alcohol Use No     History  Drug Use  . Yes  . Special: Marijuana    Social History   Social History  . Marital Status: Divorced    Spouse Name: N/A  . Number of Children: N/A  . Years of Education: N/A   Social History Main Topics  . Smoking status: Current Every Day Smoker  . Smokeless tobacco: None  . Alcohol Use: No  . Drug Use: Yes    Special: Marijuana  . Sexual Activity: Not Asked   Other Topics Concern  . None   Social History Narrative   Additional Social History:                         Sleep: Fair  Appetite:  Fair  Current Medications: Current Facility-Administered Medications  Medication Dose Route Frequency Provider Last Rate Last Dose  . acetaminophen (TYLENOL) tablet 650 mg  650 mg Oral Q6H PRN Patrecia Pour, NP      . alum & mag hydroxide-simeth (MAALOX/MYLANTA) 200-200-20 MG/5ML suspension 30 mL  30 mL Oral  Q4H PRN Patrecia Pour, NP   30 mL at 11/15/15 1838  . butalbital-acetaminophen-caffeine (FIORICET, ESGIC) 50-325-40 MG per tablet 2 tablet  2 tablet Oral Q4H PRN Nicholaus Bloom, MD   2 tablet at 11/17/15 1549  . cloNIDine (CATAPRES) tablet 0.1 mg  0.1 mg Oral BID Nicholaus Bloom, MD   0.1 mg at 11/17/15 0751  . feeding supplement (ENSURE ENLIVE) (ENSURE ENLIVE) liquid 237 mL  237 mL Oral BID BM Nicholaus Bloom, MD   237 mL at 11/15/15 1000  . gabapentin (NEURONTIN) capsule 100 mg  100 mg Oral TID Nicholaus Bloom, MD   100 mg at 11/17/15 1143  . hydrOXYzine (ATARAX/VISTARIL) tablet 25 mg  25 mg Oral Q6H PRN Nicholaus Bloom, MD   25 mg at 11/16/15 0800  . ibuprofen (ADVIL,MOTRIN) tablet 600 mg  600 mg Oral Q6H PRN Patrecia Pour, NP   600 mg at 11/15/15 [id number]  . [START ON 11/18/2015] lisinopril (PRINIVIL,ZESTRIL) tablet 10 mg  10 mg Oral Daily Nicholaus Bloom, MD      . magnesium hydroxide (MILK OF MAGNESIA) suspension 30 mL  30 mL Oral Daily PRN Patrecia Pour, NP      . nicotine (NICODERM CQ - dosed in mg/24  hours) patch 21 mg  21 mg Transdermal Daily Nicholaus Bloom, MD   21 mg at 11/17/15 0751  . OXcarbazepine (TRILEPTAL) tablet 150 mg  150 mg Oral BID Nicholaus Bloom, MD   150 mg at 11/17/15 0751  . pantoprazole (PROTONIX) EC tablet 40 mg  40 mg Oral Daily Nicholaus Bloom, MD   40 mg at 11/17/15 0750  . ziprasidone (GEODON) capsule 20 mg  20 mg Oral BID WC Nicholaus Bloom, MD   20 mg at 11/17/15 0750  . zolpidem (AMBIEN) tablet 5 mg  5 mg Oral QHS PRN Nicholaus Bloom, MD        Lab Results:  Results for orders placed or performed during the hospital encounter of 11/14/15 (from the past 48 hour(s))  TSH     Status: None   Collection Time: 11/17/15  6:10 AM  Result Value Ref Range   TSH 0.830 0.350 - 4.500 uIU/mL    Comment: Performed at [facility]  Lipid panel     Status: Abnormal   Collection Time: 11/17/15  6:10 AM  Result Value Ref Range   Cholesterol 198 0 - 200 mg/dL   Triglycerides  119 <150 mg/dL   HDL 39 (L) >40 mg/dL   Total CHOL/HDL Ratio 5.1 RATIO   VLDL 24 0 - 40 mg/dL   LDL Cholesterol 135 (H) 0 - 99 mg/dL    Comment:        Total Cholesterol/HDL:CHD Risk Coronary Heart Disease Risk Table                     Men   Women  1/2 Average Risk   3.4   3.3  Average Risk       5.0   4.4  2 X Average Risk   9.6   7.1  3 X Average Risk  23.4   11.0        Use the calculated Patient Ratio above and the CHD Risk Table to determine the patient's CHD Risk.        ATP III CLASSIFICATION (LDL):  <100     mg/dL   Optimal  100-129  mg/dL   Near or Above                    Optimal  130-159  mg/dL   Borderline  160-189  mg/dL   High  >190     mg/dL   Very High Performed at [facility]     Physical Findings: AIMS: Facial and Oral Movements Muscles of Facial Expression: None, normal Lips and Perioral Area: None, normal Jaw: None, normal Tongue: None, normal,Extremity Movements Upper (arms, wrists, hands, fingers): None, normal Lower (legs, knees, ankles, toes): None, normal, Trunk Movements Neck, shoulders, hips: None, normal, Overall Severity Severity of abnormal movements (highest score from questions above): None, normal Incapacitation due to abnormal movements: None, normal Patient's awareness of abnormal movements (rate only patient's report): No Awareness, Dental Status Current problems with teeth and/or dentures?: No Does patient usually wear dentures?: No  CIWA:    COWS:     Musculoskeletal: Strength & Muscle Tone: within normal limits Gait & Station: normal Patient leans: normal  Psychiatric Specialty Exam: Review of Systems  Constitutional: Negative.   Eyes: Negative.   Respiratory: Negative.   Cardiovascular: Negative.   Gastrointestinal: Negative.   Genitourinary: Negative.   Musculoskeletal: Negative.   Skin: Negative.   Neurological: Positive for headaches.  Endo/Heme/Allergies: Negative.   Psychiatric/Behavioral: Positive  for depression. The patient is nervous/anxious.     Blood pressure 130/97, pulse 87, temperature 97.9 F (36.6 C), temperature source Oral, resp. rate 18, height 5' 6.5" (1.689 m), weight 98.884 kg (218 lb), last menstrual period 10/27/2015.Body mass index is 34.66 kg/(m^2).  General Appearance: Fairly Groomed  Engineer, water::  Fair  Speech:  Clear and Coherent  Volume:  fluctuates  Mood:  Anxious and worried  Affect:  anxious worried  Thought Process:  Coherent and Goal Directed  Orientation:  Full (Time, Place, and Person)  Thought Content:  symptoms events worries concerns  Suicidal Thoughts:  No  Homicidal Thoughts:  No  Memory:  Immediate;   Fair Recent;   Fair Remote;   Fair  Judgement:  Fair  Insight:  Present and Shallow  Psychomotor Activity:  Restlessness  Concentration:  Fair  Recall:  [employer] of Knowledge:Fair  Language: Fair  Akathisia:  No  Handed:  Right  AIMS (if indicated):     Assets:  Desire for Improvement  ADL's:  Intact  Cognition: WNL  Sleep:  Number of Hours: 6.75   Treatment Plan Summary: Daily contact with patient to assess and evaluate symptoms and progress in treatment and Medication management Supportive approach/coping skills Mood instability; will pursue the Trileptal 150/Geodon 20 mg BID and allow more time to develop tolerance to the sedative effects BP: will continue the Clonidine 0.1 mg BID and and increase the Lisinopril to 10 mg Headache; will continue the Fioricet and the Neurontin at the same time improving control of her BP Brain MRI; she did have an MRI years ago and 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 The current MRI has no acute findings (tumor) but does have some mild changes at the level of the white matter. She will be referred to a neurologist for further evaluation on an outpatient basis Will continue to work with CBT/mindfulness Jerry Haugen A 11/17/2015, 3:56 PM

## 2015-11-17 NOTE — [facility] Group Notes (Signed)
[facility] LCSW Aftercare Discharge Planning Group Note   11/17/2015 9:34 AM  Participation Quality:  Minimal   Mood/Affect:  Anxious and Irritable  Depression Rating:  5-6  Anxiety Rating:  5-6  Thoughts of Suicide:  No Will you contract for safety?   NA  Current AVH:  No  Plan for Discharge/Comments:  Pt reports that she met with Tiffany at the [facility] and "it went well." Pt has appts scheduled with [facility] for counseling and plans to go to [facility] for medication management. Fixating on thoughts and racing thoughts. Trouble reading.   Transportation Means: unknown at this time   Supports: sister  Smart,  LCSW   

## 2015-11-17 NOTE — Progress Notes (Signed)
Patient ID: Erie Noe, female   DOB: [birth year], 45 y.o.   MRN: [id number]  DAR: Pt. Denies HI and A/V Hallucinations. Patient reports passive SI but is able to contract for safety. She reported feeling, "dazed and confused" this morning and reported some blurry vision. However, that subsided by this afternoon. She reports sleep was good, appetite is fair, energy level is low, and concentration is poor. She rates depression 6/10, and hopelessness 9/10.  Patient reports a mild headache but refused intervention but is aware medication is available if it worsens. Support and encouragement provided to the patient. Scheduled medications administered to patient per physician's orders. Patient is receptive and cooperative. She is seen in the milieu and is interacting with her peers. Q15 minute checks are maintained for safety.

## 2015-11-17 NOTE — [facility] Group Notes (Signed)
[facility] LCSW Group Therapy  11/17/2015 12:48 PM  Type of Therapy:  Group Therapy  Participation Level:  Active  Participation Quality:  Attentive  Affect:  Appropriate  Cognitive:  Alert  Insight:  Improving  Engagement in Therapy:  Engaged  Modes of Intervention:  Confrontation, Discussion, Education, Exploration, Problem-solving, Rapport Building, Socialization and Support  Summary of Progress/Problems: Emotion Regulation: This group focused on both positive and negative emotion identification and allowed group members to process ways to identify feelings, regulate negative emotions, and find healthy ways to manage internal/external emotions. Group members were asked to reflect on a time when their reaction to an emotion led to a negative outcome and explored how alternative responses using emotion regulation would have benefited them. Group members were also asked to discuss a time when emotion regulation was utilized when a negative emotion was experienced. Barbara Ellis was engaged and attentive during today's processing group. She shared that her most difficult emotion to regulate is "anger." she reports that her anger has alienated her from friends and family, destroyed relationships, and caused her to turn to marijuana abuse in order to manage her negative emotions. Barbara Ellis continues to make progress in the group setting and demonstrates improving insight AEB her ability to process how anger has negatively affected her life. "I need to start channeling that anger at the right places, people, and things. Like at the people that try to give me drugs and want to see me fail."    Smart, Gregroy Dombkowski LCSW 11/17/2015, 12:48 PM

## 2015-11-17 NOTE — Progress Notes (Signed)
Recreation Therapy Notes  Date: 01.18.2017 Time: 9:30am Location: [address] Dayroom   Group Topic: Stress Management  Goal Area(s) Addresses:  Patient will actively participate in stress management techniques presented during session.   Behavioral Response: Engaged, Attentive.   Intervention: Stress management techniques  Activity :  Deep Breathing and Progressive Muscle Relaxation. LRT provided instruction and demonstration on practice of Progressive Muscle Relaxation. Technique was coupled with deep breathing.   Education:  Stress Management, Discharge Planning.   Education Outcome: Acknowledges education  Clinical Observations/Feedback: Patient actively engaged in technique introduced, expressed no concerns and demonstrated ability to practice independently post d/c.   Laureen Ochs Adajah Cocking, LRT/CTRS  Tahlor Berenguer L 11/17/2015 11:38 AM

## 2015-11-17 NOTE — Progress Notes (Signed)
Pt was in bed most of the evening.  Writer did observe her up and talking on the the phone at one point, but she went back to bed and to sleep before writer was able to speak with her.  She does not have any scheduled medications this evening.  It is noted that pt was started on Geodon today which causes drowsiness, and pt was allowed to sleep without being disturbed.  No distress observed during q15 minute safety checks.  Pt safe at this time.

## 2015-11-17 NOTE — Progress Notes (Signed)
Pt attended NA group this evening.  

## 2015-11-18 NOTE — Progress Notes (Signed)
[facility] MD Progress Note  11/18/2015 4:15 PM Barbara Ellis  MRN:  [id number] Subjective:  Barbara Ellis is having a very hard time. She is still endorsing a lot of mood swings. States she talked to her on and off BF of 6 years and told him to call her today if he was willing to quit drinking and take his mental health medications so they could work on having a relationship and he did not what made her belief that it is over between them. She feels it is yet another loss. She states she is worry about staying at the shelter on medications as states she would not be able to defend herself and people would take advantage of her. States she knows she needs to stay on her medications but states she knows herself and the dynamics at the shelter and is afraid Principal Problem: Bipolar I disorder, most recent episode depressed ([place]) Diagnosis:   Patient Active Problem List   Diagnosis Date Noted  . Bipolar I disorder, most recent episode depressed ([place]) [F31.30] 11/15/2015   Total Time spent with patient: 30 minutes  Past Psychiatric History: see admission H and P  Past Medical History:  Past Medical History  Diagnosis Date  . Hypertension   . Bipolar 1 disorder [facility])     Past Surgical History  Procedure Laterality Date  . C section    . Tubal ligation     Family History: History reviewed. No pertinent family history. Family Psychiatric  History: see admission H and P Social History:  History  Alcohol Use No     History  Drug Use  . Yes  . Special: Marijuana    Social History   Social History  . Marital Status: Divorced    Spouse Name: N/A  . Number of Children: N/A  . Years of Education: N/A   Social History Main Topics  . Smoking status: Current Every Day Smoker  . Smokeless tobacco: None  . Alcohol Use: No  . Drug Use: Yes    Special: Marijuana  . Sexual Activity: Not Asked   Other Topics Concern  . None   Social History Narrative   Additional Social History:                          Sleep: Fair  Appetite:  Fair  Current Medications: Current Facility-Administered Medications  Medication Dose Route Frequency Provider Last Rate Last Dose  . acetaminophen (TYLENOL) tablet 650 mg  650 mg Oral Q6H PRN Patrecia Pour, NP      . alum & mag hydroxide-simeth (MAALOX/MYLANTA) 200-200-20 MG/5ML suspension 30 mL  30 mL Oral Q4H PRN Patrecia Pour, NP   30 mL at 11/15/15 1838  . butalbital-acetaminophen-caffeine (FIORICET, ESGIC) 50-325-40 MG per tablet 2 tablet  2 tablet Oral Q4H PRN Nicholaus Bloom, MD   2 tablet at 11/18/15 1204  . cloNIDine (CATAPRES) tablet 0.1 mg  0.1 mg Oral BID Nicholaus Bloom, MD   0.1 mg at 11/18/15 0802  . feeding supplement (ENSURE ENLIVE) (ENSURE ENLIVE) liquid 237 mL  237 mL Oral BID BM Nicholaus Bloom, MD   237 mL at 11/15/15 1000  . gabapentin (NEURONTIN) capsule 100 mg  100 mg Oral TID Nicholaus Bloom, MD   100 mg at 11/18/15 1200  . hydrOXYzine (ATARAX/VISTARIL) tablet 25 mg  25 mg Oral Q6H PRN Nicholaus Bloom, MD   25 mg at 11/16/15 0800  . ibuprofen (  ADVIL,MOTRIN) tablet 600 mg  600 mg Oral Q6H PRN Patrecia Pour, NP   600 mg at 11/15/15 [id number]  . lisinopril (PRINIVIL,ZESTRIL) tablet 10 mg  10 mg Oral Daily Nicholaus Bloom, MD   10 mg at 11/18/15 0801  . magnesium hydroxide (MILK OF MAGNESIA) suspension 30 mL  30 mL Oral Daily PRN Patrecia Pour, NP      . nicotine (NICODERM CQ - dosed in mg/24 hours) patch 21 mg  21 mg Transdermal Daily Nicholaus Bloom, MD   21 mg at 11/18/15 0802  . OXcarbazepine (TRILEPTAL) tablet 150 mg  150 mg Oral BID Nicholaus Bloom, MD   150 mg at 11/18/15 0801  . pantoprazole (PROTONIX) EC tablet 40 mg  40 mg Oral Daily Nicholaus Bloom, MD   40 mg at 11/18/15 0801  . ziprasidone (GEODON) capsule 20 mg  20 mg Oral BID WC Nicholaus Bloom, MD   20 mg at 11/18/15 0801  . zolpidem (AMBIEN) tablet 5 mg  5 mg Oral QHS PRN Nicholaus Bloom, MD   5 mg at 11/17/15 2143    Lab Results:  Results for orders placed or performed during the  hospital encounter of 11/14/15 (from the past 48 hour(s))  TSH     Status: None   Collection Time: 11/17/15  6:10 AM  Result Value Ref Range   TSH 0.830 0.350 - 4.500 uIU/mL    Comment: Performed at [facility]  Lipid panel     Status: Abnormal   Collection Time: 11/17/15  6:10 AM  Result Value Ref Range   Cholesterol 198 0 - 200 mg/dL   Triglycerides 119 <150 mg/dL   HDL 39 (L) >40 mg/dL   Total CHOL/HDL Ratio 5.1 RATIO   VLDL 24 0 - 40 mg/dL   LDL Cholesterol 135 (H) 0 - 99 mg/dL    Comment:        Total Cholesterol/HDL:CHD Risk Coronary Heart Disease Risk Table                     Men   Women  1/2 Average Risk   3.4   3.3  Average Risk       5.0   4.4  2 X Average Risk   9.6   7.1  3 X Average Risk  23.4   11.0        Use the calculated Patient Ratio above and the CHD Risk Table to determine the patient's CHD Risk.        ATP III CLASSIFICATION (LDL):  <100     mg/dL   Optimal  100-129  mg/dL   Near or Above                    Optimal  130-159  mg/dL   Borderline  160-189  mg/dL   High  >190     mg/dL   Very High Performed at [facility]     Physical Findings: AIMS: Facial and Oral Movements Muscles of Facial Expression: None, normal Lips and Perioral Area: None, normal Jaw: None, normal Tongue: None, normal,Extremity Movements Upper (arms, wrists, hands, fingers): None, normal Lower (legs, knees, ankles, toes): None, normal, Trunk Movements Neck, shoulders, hips: None, normal, Overall Severity Severity of abnormal movements (highest score from questions above): None, normal Incapacitation due to abnormal movements: None, normal Patient's awareness of abnormal movements (rate only patient's report): No Awareness, Dental Status Current problems  with teeth and/or dentures?: No Does patient usually wear dentures?: No  CIWA:    COWS:     Musculoskeletal: Strength & Muscle Tone: within normal limits Gait & Station: normal Patient  leans: normal  Psychiatric Specialty Exam: Review of Systems  Constitutional: Negative.   HENT: Negative.   Eyes: Negative.   Respiratory: Negative.   Cardiovascular: Negative.   Gastrointestinal: Negative.   Genitourinary: Negative.   Musculoskeletal: Negative.   Skin: Negative.   Neurological: Negative.   Endo/Heme/Allergies: Negative.   Psychiatric/Behavioral: Positive for depression. The patient is nervous/anxious.     Blood pressure 142/85, pulse 90, temperature 98.4 F (36.9 C), temperature source Oral, resp. rate 18, height 5' 6.5" (1.689 m), weight 98.884 kg (218 lb), last menstrual period 10/27/2015.Body mass index is 34.66 kg/(m^2).  General Appearance: Fairly Groomed  Engineer, water::  Fair  Speech:  Clear and Coherent  Volume:  fluctuates  Mood:  Anxious, Depressed and Dysphoric  Affect:  Labile and Tearful  Thought Process:  Coherent and Goal Directed  Orientation:  Full (Time, Place, and Person)  Thought Content:  symptoms events worries concerns  Suicidal Thoughts:  No  Homicidal Thoughts:  No  Memory:  Immediate;   Fair Recent;   Fair Remote;   Fair  Judgement:  Fair  Insight:  Present  Psychomotor Activity:  Restlessness  Concentration:  Fair  Recall:  [employer] of Knowledge:Fair  Language: Fair  Akathisia:  No  Handed:  Right  AIMS (if indicated):     Assets:  Desire for Improvement  ADL's:  Intact  Cognition: WNL  Sleep:  Number of Hours: 6.25   Treatment Plan Summary: Daily contact with patient to assess and evaluate symptoms and progress in treatment and Medication management Supportive approach/coping skills Mood instability; continue to work with the Trileptal 150 mg BID/Geodon 20 mg BID and optimize dose-response Pain; continue to work with the Neurontin/Fioricet Work with CBT/mindfulness Help process the loss of the BF if he was not to committed to his own mental health/recovery Help explore placement options Barbara Ellis A 11/18/2015,  4:15 PM

## 2015-11-18 NOTE — [facility] Group Notes (Signed)
[facility] Group Notes:  (Nursing/MHT/Case Management/Adjunct)  Date:  11/18/2015  Time:  2:08 PM   Type of Therapy:  Psychoeducational Skills  Participation Level:  Did Not Attend  Participation Quality:  DID NOT ATTEND  Affect:  DID NOT ATTEND  Cognitive:  DID NOT ATTEND  Insight:  None  Engagement in Group:  DID NOT ATTEND  Modes of Intervention:  DID NOT ATTEND  Summary of Progress/Problems: Pt did not attend patient self inventory group.   Wolfgang Phoenix 11/18/2015, 2:08 PM

## 2015-11-18 NOTE — Progress Notes (Addendum)
Pt currently presents with a falt affect and depressed behavior. Per self inventory, pt rates depression at a 5, hopelessness 6 and anxiety 8. Pt's daily goal is to "make calls, pray" and they intend to do so by "helping others, should be a joy, not a paycheck and we know, see and feel the difference." Pt reports good sleep, a  fair appetite, low energy and poor concentration. Pt's safety ensured with 15 minute and environmental checks. Pt currently denies SI/HI and A/V hallucinations. Pt verbally agrees to seek staff if SI/HI or A/VH occurs and to consult with staff before acting on these thoughts. Will continue POC.

## 2015-11-18 NOTE — Progress Notes (Signed)
Patient ID: Barbara Ellis, female   DOB: August 27, 1971, 45 y.o.   MRN: [id number] D: Client report depression "4" of 10. "this morning was kind of shaky, they was going to discharge me but I didn't have no where to go" "[facility] didn't have a bed until Monday, but they found me a place at [facility] "I talk to my sister" "slept well" "the only thing is I haven't been using the BR like I'm use to, I usually go three times a day" A: Writer provided emotional support recommend client avoid caffeine, increase water intake and consider tepid water as opposed to iced water to promote peristalsis. Client also given MOM, to help with regularity. Medications reviewed, administered as ordered. Staff will monitor q82min for safety. R: Client is safe on the unit, attended group.

## 2015-11-18 NOTE — [facility] Group Notes (Signed)
[facility] LCSW Group Therapy  11/18/2015 12:51 PM  Type of Therapy:  Group Therapy  Participation Level:  Minimal  Participation Quality:  Drowsy  Affect:  Depressed and Irritable  Cognitive:  Lacking  Insight:  Limited  Engagement in Therapy:  Limited  Modes of Intervention:  Confrontation, Discussion, Education, Problem-solving, Rapport Building, Socialization and Support  Summary of Progress/Problems:  Finding Balance in Life. Today's group focused on defining balance in one's own words, identifying things that can knock one off balance, and exploring healthy ways to maintain balance in life. Group members were asked to provide an example of a time when they felt off balance, describe how they handled that situation,and process healthier ways to regain balance in the future. Group members were asked to share the most important tool for maintaining balance that they learned while at [facility] and how they plan to apply this method after discharge. Barbara Ellis minimally participated in today's group. Lethargic during group. Pt stated that she has not seen the doctor and feels depressed, "I feel like I'm being kicked out to the same position I was in." CSW provided emotional support-pt resistant and did not talk in group after this.   Ellis, Barbara Peace LCSW 11/18/2015, 12:51 PM

## 2015-11-18 NOTE — Progress Notes (Signed)
[place] Group Notes:  (Nursing/MHT/Case Management/Adjunct)  Date:  11/18/2015  Time: 2045 Type of Therapy:  wrap up group  Participation Level:  Active  Participation Quality:  Appropriate, Attentive, Sharing and Supportive  Affect:  Appropriate  Cognitive:  Appropriate  Insight:  Improving  Engagement in Group:  Engaged  Modes of Intervention:  Clarification, Education and Support  Summary of Progress/Problems:  Barbara Ellis 11/18/2015, 9:32 PM

## 2015-11-18 NOTE — Progress Notes (Signed)
CSW spoke with Bettey Mare (peer support specialist for Path Program) [phone] regarding pt assessment. She requested that pt come to [facility] by 11am on Monday for a second assessment for services. Pt made aware.   Maxie Better, MSW, LCSW Clinical Social Worker 11/18/2015 12:34 PM

## 2015-11-18 NOTE — Plan of Care (Signed)
Problem: Alteration in mood Goal: LTG-Patient reports reduction in suicidal thoughts (Patient reports reduction in suicidal thoughts and is able to verbalize a safety plan for whenever patient is feeling suicidal)  Outcome: Progressing Pt denied SI/HI and verbally contracted for safety.

## 2015-11-19 LAB — HEMOGLOBIN A1C
Hgb A1c MFr Bld: 5.5 % (ref 4.8–5.6)
MEAN PLASMA GLUCOSE: 111 mg/dL

## 2015-11-19 MED ORDER — ZIPRASIDONE HCL 40 MG PO CAPS
40.0000 mg | ORAL_CAPSULE | Freq: Two times a day (BID) | ORAL | Status: DC
  Administered 2015-11-19 – 2015-11-22 (×6): 40 mg via ORAL
  Filled 2015-11-19 (×8): qty 1
  Filled 2015-11-19 (×2): qty 14

## 2015-11-19 MED ORDER — ZIPRASIDONE HCL 20 MG PO CAPS: 20.0000 mg | ORAL_CAPSULE | Freq: Two times a day (BID) | ORAL | Status: DC

## 2015-11-19 NOTE — Progress Notes (Signed)
Recreation Therapy Notes  Date: 01.20.2017 Time: 9:30am Location: 300 Hall Group Room   Group Topic: Stress Management  Goal Area(s) Addresses:  Patient will actively participate in stress management techniques presented during session.   Behavioral Response: Appropriate, Engaged   Intervention: Stress management techniques  Activity :  Deep Breathing and Mindfulness. LRT provided instruction and demonstration on practice of Mindfulness. Technique was coupled with deep breathing.   Education:  Stress Management, Discharge Planning.   Education Outcome: Acknowledges education  Clinical Observations/Feedback: Patient actively engaged in technique introduced, expressed no concerns and demonstrated ability to practice independently post d/c.   Laureen Ochs Toua Stites, LRT/CTRS  Rafaella Kole L 11/19/2015 1:47 PM

## 2015-11-19 NOTE — Progress Notes (Signed)
Pt attended evening AA group. 

## 2015-11-19 NOTE — BHH Group Notes (Signed)
[facility] LCSW Aftercare Discharge Planning Group Note   11/19/2015 9:20 AM  Participation Quality:  Appropriate   Mood/Affect:  Appropriate  Depression Rating:  0  Anxiety Rating:  "I think I'm manic today."   Thoughts of Suicide:  No Will you contract for safety?   NA  Current AVH:  No  Plan for Discharge/Comments:  Pt reports that she took [place] last night and "dreamed about candy." PT pleasant and calm this morning. Requesting two letters for work and 1 letter for disability. Slept well.   Transportation Means: pt has car at [place] parking lot  Supports: sister  Proofreader, Water quality scientist LCSW

## 2015-11-19 NOTE — Progress Notes (Signed)
D Barbara Ellis is observed standing at the med window this morning and when this writer asks her how she is, she replied " I feel manic today". She goes on to say that she feels " high ..  I haven't even taken anything...". She did complete her daily assessment and on it she wrote she  Denied SI and she rated her depression, hopelessness and anxiety 2 " 2-3 / 2 / 2 ", respectively.  R Safety in place and poc cont. Geodon increase by Dr. Sabra Heck to 40 mg bid, to cover pt's manic feelings.
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
## 2015-11-19 NOTE — BHH Group Notes (Signed)
[place] LCSW Group Therapy 11/19/2015 1:15 PM Type of Therapy: Group Therapy Participation Level: Active  Participation Quality: Attentive, Sharing and Supportive  Affect: Appropriate  Cognitive: Alert and Oriented  Insight: Developing/Improving and Engaged  Engagement in Therapy: Developing/Improving and Engaged  Modes of Intervention: Clarification, Confrontation, Discussion, Education, Exploration, Limit-setting, Orientation, Problem-solving, Rapport Building, Art therapist, Socialization and Support  Summary of Progress/Problems: The topic for today was feelings about relapse. Pt discussed what relapse prevention is to them and identified triggers that they are on the path to relapse. Pt processed their feeling towards relapse and was able to relate to peers. Pt discussed coping skills that can be used for relapse prevention. Patient identified using THC and hanging out with the wrong people as her relapse behavior which is triggered by her homelessness. She identifies financial responsibility in the form of budgeting her money as her goal at discharge. CSW and other group members provided patient with emotional support and encouragement. Patient left group early for unknown reason and did not return.   Tilden Fossa, MSW, [place] Worker [facility] [phone]

## 2015-11-19 NOTE — Progress Notes (Signed)
[facility] MD Progress Note  11/19/2015 1:58 PM Barbara Ellis  MRN:  [id number] Subjective:  Barbara Ellis has experienced increased mood fluctuations today. She is having some vivid dreams. Feels more "hyper" at the same time some underlying increased irritability. She is still trying to get herself stable enough so he can function outside of here. States she is trying to learn what to do when she sees her mood going up and down Principal Problem: Bipolar I disorder, most recent episode depressed (Barbara Ellis) Diagnosis:   Patient Active Problem List   Diagnosis Date Noted  . Bipolar I disorder, most recent episode depressed ([place]) [F31.30] 11/15/2015   Total Time spent with patient: 30 minutes  Past Psychiatric History: see admssion H and P  Past Medical History:  Past Medical History  Diagnosis Date  . Hypertension   . Bipolar 1 disorder [facility])     Past Surgical History  Procedure Laterality Date  . C section    . Tubal ligation     Family History: History reviewed. No pertinent family history. Family Psychiatric  History: see admission H and P Social History:  History  Alcohol Use No     History  Drug Use  . Yes  . Special: Marijuana    Social History   Social History  . Marital Status: Divorced    Spouse Name: N/A  . Number of Children: N/A  . Years of Education: N/A   Social History Main Topics  . Smoking status: Current Every Day Smoker  . Smokeless tobacco: None  . Alcohol Use: No  . Drug Use: Yes    Special: Marijuana  . Sexual Activity: Not Asked   Other Topics Concern  . None   Social History Narrative   Additional Social History:                         Sleep: Fair  Appetite:  Fair  Current Medications: Current Facility-Administered Medications  Medication Dose Route Frequency Provider Last Rate Last Dose  . acetaminophen (TYLENOL) tablet 650 mg  650 mg Oral Q6H PRN Patrecia Pour, NP      . alum & mag hydroxide-simeth (MAALOX/MYLANTA) 200-200-20  MG/5ML suspension 30 mL  30 mL Oral Q4H PRN Patrecia Pour, NP   30 mL at 11/15/15 1838  . butalbital-acetaminophen-caffeine (FIORICET, ESGIC) 50-325-40 MG per tablet 2 tablet  2 tablet Oral Q4H PRN Nicholaus Bloom, MD   2 tablet at 11/18/15 1204  . cloNIDine (CATAPRES) tablet 0.1 mg  0.1 mg Oral BID Nicholaus Bloom, MD   0.1 mg at 11/19/15 0855  . feeding supplement (ENSURE ENLIVE) (ENSURE ENLIVE) liquid 237 mL  237 mL Oral BID BM Nicholaus Bloom, MD   237 mL at 11/15/15 1000  . gabapentin (NEURONTIN) capsule 100 mg  100 mg Oral TID Nicholaus Bloom, MD   100 mg at 11/19/15 0855  . hydrOXYzine (ATARAX/VISTARIL) tablet 25 mg  25 mg Oral Q6H PRN Nicholaus Bloom, MD   25 mg at 11/18/15 2129  . ibuprofen (ADVIL,MOTRIN) tablet 600 mg  600 mg Oral Q6H PRN Patrecia Pour, NP   600 mg at 11/15/15 [id number]  . lisinopril (PRINIVIL,ZESTRIL) tablet 10 mg  10 mg Oral Daily Nicholaus Bloom, MD   10 mg at 11/19/15 0855  . magnesium hydroxide (MILK OF MAGNESIA) suspension 30 mL  30 mL Oral Daily PRN Patrecia Pour, NP   30 mL at 11/18/15 2126  .  nicotine (NICODERM CQ - dosed in mg/24 hours) patch 21 mg  21 mg Transdermal Daily Nicholaus Bloom, MD   21 mg at 11/19/15 0856  . OXcarbazepine (TRILEPTAL) tablet 150 mg  150 mg Oral BID Nicholaus Bloom, MD   150 mg at 11/19/15 0855  . pantoprazole (PROTONIX) EC tablet 40 mg  40 mg Oral Daily Nicholaus Bloom, MD   40 mg at 11/19/15 0855  . ziprasidone (GEODON) capsule 40 mg  40 mg Oral BID WC Nicholaus Bloom, MD      . zolpidem [facility]) tablet 5 mg  5 mg Oral QHS PRN Nicholaus Bloom, MD   5 mg at 11/18/15 2128    Lab Results: No results found for this or any previous visit (from the past 48 hour(s)).  Physical Findings: AIMS: Facial and Oral Movements Muscles of Facial Expression: None, normal Lips and Perioral Area: None, normal Jaw: None, normal Tongue: None, normal,Extremity Movements Upper (arms, wrists, hands, fingers): None, normal Lower (legs, knees, ankles, toes): None, normal,  Trunk Movements Neck, shoulders, hips: None, normal, Overall Severity Severity of abnormal movements (highest score from questions above): None, normal Incapacitation due to abnormal movements: None, normal Patient's awareness of abnormal movements (rate only patient's report): No Awareness, Dental Status Current problems with teeth and/or dentures?: No Does patient usually wear dentures?: No  CIWA:    COWS:     Musculoskeletal: Strength & Muscle Tone: within normal limits Gait & Station: normal Patient leans: normal  Psychiatric Specialty Exam: Review of Systems  Constitutional: Negative.   HENT: Negative.   Eyes: Negative.   Respiratory: Negative.   Cardiovascular: Negative.   Gastrointestinal: Negative.   Genitourinary: Negative.   Musculoskeletal: Negative.   Skin: Negative.   Neurological: Negative.   Endo/Heme/Allergies: Negative.   Psychiatric/Behavioral: Positive for depression. The patient is nervous/anxious.     Blood pressure 142/104, pulse 86, temperature 98.4 F (36.9 C), temperature source Oral, resp. rate 18, height 5' 6.5" (1.689 m), weight 98.884 kg (218 lb), last menstrual period 10/27/2015.Body mass index is 34.66 kg/(m^2).  General Appearance: Fairly Groomed  Engineer, water::  Fair  Speech:  Clear and Coherent  Volume:  fluctuates  Mood:  Dysphoric  Affect:  Labile  Thought Process:  Coherent and Goal Directed  Orientation:  Full (Time, Place, and Person)  Thought Content:  symptoms events worries concerns  Suicidal Thoughts:  No  Homicidal Thoughts:  No  Memory:  Immediate;   Fair Recent;   Fair Remote;   Fair  Judgement:  Fair  Insight:  Present and Shallow  Psychomotor Activity:  Restlessness  Concentration:  Fair  Recall:  [employer] of Knowledge:Fair  Language: Fair  Akathisia:  No  Handed:  Right  AIMS (if indicated):     Assets:  Desire for Improvement  ADL's:  Intact  Cognition: WNL  Sleep:  Number of Hours: 5.25   Treatment Plan  Summary: Daily contact with patient to assess and evaluate symptoms and progress in treatment and Medication management Supportive approach/coping skills Mood instability: continue the Trileptal at 150 mg BID, increase the Geodon to 40 mg BID Work with CBT/mindfulness Laconda Basich A 11/19/2015, 1:58 PM

## 2015-11-20 MED ORDER — MAGNESIUM CITRATE PO SOLN
1.0000 | Freq: Once | ORAL | Status: AC
  Administered 2015-11-20: 1 via ORAL

## 2015-11-20 NOTE — Progress Notes (Signed)
At the beginning of the shift, pt was in her room with a visitor.  Shift assessment was done after group time, at which time the pt reported that her main complaint today is that she is constipated and it is making her very irritable.  She reported that she was given MOM yesterday with minimal results.  Writer gave her suggestions and she said that she had done all of them.  She was given MOM for a second time and assured that if she had not had a BM by morning, that writer would inform the provider and see if an order for a stool softener or mag citrate could be given.  At time of assessment, pt denies SI/HI/AVH.  She reports that she is working with the CSW to make a plan for discharge.  She says that she may be discharged on Monday.  Support and encouragement offered.  Discharge plans are in process.  Will monitor pt for resolution of constipation.  Support and encouragement offered.  Safety maintained with q15 minute checks.

## 2015-11-20 NOTE — BHH Group Notes (Signed)
[place] Group Notes:  (Clinical Social Work)   11/20/2015 1:15-2:15PM  Summary of Progress/Problems:   Today's process group involved patients discussing their feelings related to being hospitalized, as well as how they can use their present feelings to create a goal for the day.  A lengthy list of unhealthy coping techniques was discussed and group was able to state the connection between having a need and using a coping technique to feel that need, sometimes unhealthy and sometimes healthy.  Motivational interviewing was used to bring about a discussion about making choices about the unhealthy coping skills identified by each patient.   The patient expressed a primary feeling about being hospitalized of absolutely nothing, saying she had no feelings at all one way or the other.  The patient expressed that the unhealthy coping he often uses is blaming others, then she spent a good deal of the group session talking about how she cannot get help and therefore resorts to stealing and shoplifting.  She was angry at the place where she has been getting her mental health care, [facility], and CSW provided her with a person's name to call and complain.  She would like to switch to another provider.  Type of Therapy:  Group Therapy - Process   Participation Level:  Active  Participation Quality:  Attentive and Sharing  Affect:  Blunted and Depressed  Cognitive:  Appropriate and Oriented  Insight:  Developing/Improving  Engagement in Therapy:  Engaged  Modes of Intervention:  Discussion, Motivational Interviewing  Selmer Dominion, LCSW 11/20/2015, 5:18 PM

## 2015-11-20 NOTE — Progress Notes (Signed)
[facility] MD Progress Note  11/20/2015 12:05 PM Barbara Ellis  MRN:  [id number] Subjective:  Patient reports " I am still a little depressed"  Barbara Ellis is awake, alert and oriented X3 , found standing at medication window talking to the nurse.  Denies suicidal or homicidal ideation. Denies auditory or visual hallucination and does not appear to be responding to internal stimuli. Patient reports interacting well with staff and others. Patient reports she is medication compliant without mediation side effects. Report learning new coping skills to deep breath but states that the exercise is not helpful. States her depression 8/10 today, however her mood changes often. Patient reports she is unable to have a bowel movement. Reports good appetite. Patient reports she is unable to rest well at night. States she was taken Trazodone 400 mg and was changed to Ambien 5 mg and feels like this medication is not working either.  Patient report she is excited about discharge on Monday. Support, encouragement and reassurance was provided.  Principal Problem: Bipolar I disorder, most recent episode depressed ([place]) Diagnosis:   Patient Active Problem List   Diagnosis Date Noted  . Bipolar I disorder, most recent episode depressed ([place]) [F31.30] 11/15/2015   Total Time spent with patient: 30 minutes  Past Psychiatric History: see admssion H and P  Past Medical History:  Past Medical History  Diagnosis Date  . Hypertension   . Bipolar 1 disorder [facility])     Past Surgical History  Procedure Laterality Date  . C section    . Tubal ligation     Family History: History reviewed. No pertinent family history. Family Psychiatric  History: see admission H and P Social History:  History  Alcohol Use No     History  Drug Use  . Yes  . Special: Marijuana    Social History   Social History  . Marital Status: Divorced    Spouse Name: N/A  . Number of Children: N/A  . Years of Education: N/A   Social  History Main Topics  . Smoking status: Current Every Day Smoker  . Smokeless tobacco: None  . Alcohol Use: No  . Drug Use: Yes    Special: Marijuana  . Sexual Activity: Not Asked   Other Topics Concern  . None   Social History Narrative   Additional Social History:                         Sleep: Fair  Appetite:  Fair  Current Medications: Current Facility-Administered Medications  Medication Dose Route Frequency Provider Last Rate Last Dose  . acetaminophen (TYLENOL) tablet 650 mg  650 mg Oral Q6H PRN Barbara Pour, NP      . alum & mag hydroxide-simeth (MAALOX/MYLANTA) 200-200-20 MG/5ML suspension 30 mL  30 mL Oral Q4H PRN Barbara Pour, NP   30 mL at 11/15/15 1838  . butalbital-acetaminophen-caffeine (FIORICET, ESGIC) 50-325-40 MG per tablet 2 tablet  2 tablet Oral Q4H PRN Barbara Bloom, MD   2 tablet at 11/18/15 1204  . cloNIDine (CATAPRES) tablet 0.1 mg  0.1 mg Oral BID Barbara Bloom, MD   0.1 mg at 11/20/15 0815  . feeding supplement (ENSURE ENLIVE) (ENSURE ENLIVE) liquid 237 mL  237 mL Oral BID BM Barbara Bloom, MD   237 mL at 11/19/15 1500  . gabapentin (NEURONTIN) capsule 100 mg  100 mg Oral TID Barbara Bloom, MD   100 mg at 11/20/15 0815  .  hydrOXYzine (ATARAX/VISTARIL) tablet 25 mg  25 mg Oral Q6H PRN Barbara Bloom, MD   25 mg at 11/18/15 2129  . ibuprofen (ADVIL,MOTRIN) tablet 600 mg  600 mg Oral Q6H PRN Barbara Pour, NP   600 mg at 11/15/15 [id number]  . lisinopril (PRINIVIL,ZESTRIL) tablet 10 mg  10 mg Oral Daily Barbara Bloom, MD   10 mg at 11/20/15 [id number]  . magnesium hydroxide (MILK OF MAGNESIA) suspension 30 mL  30 mL Oral Daily PRN Barbara Pour, NP   30 mL at 11/19/15 2108  . nicotine (NICODERM CQ - dosed in mg/24 hours) patch 21 mg  21 mg Transdermal Daily Barbara Bloom, MD   21 mg at 11/20/15 0814  . OXcarbazepine (TRILEPTAL) tablet 150 mg  150 mg Oral BID Barbara Bloom, MD   150 mg at 11/20/15 0815  . pantoprazole (PROTONIX) EC tablet 40 mg  40 mg Oral  Daily Barbara Bloom, MD   40 mg at 11/20/15 [id number]  . ziprasidone (GEODON) capsule 40 mg  40 mg Oral BID WC Barbara Bloom, MD   40 mg at 11/20/15 [id number]  . zolpidem (AMBIEN) tablet 5 mg  5 mg Oral QHS PRN Barbara Bloom, MD   5 mg at 11/19/15 2144    Lab Results: No results found for this or any previous visit (from the past 48 hour(s)).  Physical Findings: AIMS: Facial and Oral Movements Muscles of Facial Expression: None, normal Lips and Perioral Area: None, normal Jaw: None, normal Tongue: None, normal,Extremity Movements Upper (arms, wrists, hands, fingers): None, normal Lower (legs, knees, ankles, toes): None, normal, Trunk Movements Neck, shoulders, hips: None, normal, Overall Severity Severity of abnormal movements (highest score from questions above): None, normal Incapacitation due to abnormal movements: None, normal Patient's awareness of abnormal movements (rate only patient's report): No Awareness, Dental Status Current problems with teeth and/or dentures?: No Does patient usually wear dentures?: No  CIWA:    COWS:     Musculoskeletal: Strength & Muscle Tone: within normal limits Gait & Station: normal Patient leans: normal  Psychiatric Specialty Exam: Review of Systems  Constitutional: Negative.   HENT: Negative.   Eyes: Negative.   Respiratory: Negative.   Cardiovascular: Negative.   Gastrointestinal: Negative.   Genitourinary: Negative.   Musculoskeletal: Negative.   Skin: Negative.   Neurological: Negative.   Endo/Heme/Allergies: Negative.   Psychiatric/Behavioral: Positive for depression. The patient is nervous/anxious and has insomnia.   All other systems reviewed and are negative.   Blood pressure 136/90, pulse 75, temperature 98.7 F (37.1 C), temperature source Oral, resp. rate 16, height 5' 6.5" (1.689 m), weight 98.884 kg (218 lb), last menstrual period 10/27/2015.Body mass index is 34.66 kg/(m^2).  General Appearance: Fairly Groomed, Pleasant, clam  and coopertive  Engineer, water::  Fair  Speech:  Clear and Coherent  Volume:  Normal  Mood:  Dysphoric  Affect:  Labile  Thought Process:  Coherent and Goal Directed  Orientation:  Full (Time, Place, and Person)  Thought Content:  symptoms events worries concerns  Suicidal Thoughts:  No  Homicidal Thoughts:  No  Memory:  Immediate;   Fair Recent;   Fair Remote;   Fair  Judgement:  Fair  Insight:  Present and Shallow  Psychomotor Activity:  Restlessness  Concentration:  Fair  Recall:  [place]  Language: Fair  Akathisia:  No  Handed:  Right  AIMS (if indicated):     Assets:  Desire for  Improvement Resilience Social Support  ADL's:  Intact  Cognition: WNL  Sleep:  Number of Hours: 5     I agree with current treatment plan on 11/20/2015, Patient seen face-to-face for psychiatric evaluation follow-up, chart reviewed. Reviewed the information documented and agree with the treatment plan.  Treatment Plan Summary: Daily contact with patient to assess and evaluate symptoms and progress in treatment and Medication management   Supportive approach/coping skills Mood instability: continue the Trileptal at 150 mg BID, increase the Geodon to 40 mg BID Started Ambien 5 mg for insomnia Magnesium citrated 1 bottle for constipation. Will continue to monitor vitals ,medication compliance and treatment side effects while patient is here.   CSW will start working on disposition.  Patient to participate in therapeutic milieu Work with CBT/mindfulness   Derrill Center FNP- [facility] 11/20/2015, 12:05 PM  Agree with NP Progress Note, as above

## 2015-11-20 NOTE — Progress Notes (Signed)
D.  Pt pleasant on approach, no complaints voiced other than difficulty staying asleep last night.  Pt positive for evening AA group, interacting appropriately with peers on the unit.  Denies SI/HI/hallucinations at this time, but did endorse passive SI today on day shift.  A.  Support and encouragement offered, medication given as ordered.  R. Pt remains safe on unit, will continue to monitor.

## 2015-11-20 NOTE — Progress Notes (Signed)
D Barbara Ellis has handled today a little better than yesterday. She remains hypomanic. Focused on her constipation ( she drank a bottle of magnesium sulfate and is awaiting it to kick in). She completed her daily assessment and on it she wrote she deneid SI and she rated her depression, hopelessness and anxiety " 1/0/6", respectively.  A She is practicing  Talking about healthier behaviors and says " I want to learn to do this".    A Safety is in place. R POC cont

## 2015-11-20 NOTE — Progress Notes (Signed)
Patient did attend the evening speaker AA meeting.  

## 2015-11-20 NOTE — BHH Group Notes (Addendum)
[place] Group Notes:  (Nursing/MHT/Case Management/Adjunct)  Date:  11/20/2015  Time:  1000  Type of Therapy:  Nurse Education  /  Life Skills : The group is focused on teaching patients how to identify their needs and then how to identify healthy ways to get them met.  Participation Level:  Active  Participation Quality:  Appropriate  Affect:  Appropriate  Cognitive:  Alert  Insight:  Appropriate  Engagement in Group:  Engaged  Modes of Intervention:  Education  Summary of Progress/Problems:  Lauralyn Primes 11/20/2015, 4:28 PM

## 2015-11-21 MED ORDER — ZOLPIDEM TARTRATE 10 MG PO TABS
10.0000 mg | ORAL_TABLET | Freq: Every evening | ORAL | Status: DC | PRN
  Administered 2015-11-21: 10 mg via ORAL
  Filled 2015-11-21: qty 1

## 2015-11-21 NOTE — Progress Notes (Signed)
[facility] MD Progress Note  11/21/2015 2:38 PM Barbara Ellis  MRN:  [id number] Subjective:  Patient reports " I am feeling good, I get to go home tomorrow. But I don't know if I still have a job"  Land is awake, alert and oriented X3 , found sitting in the dayroom interacting with peers.  Denies suicidal or homicidal ideation. Denies auditory or visual hallucination and does not appear to be responding to internal stimuli. Patient reports interacting well with staff and others. Patient reports she is medication compliant without mediation side effects. Patient has concerns that she may in trouble with her job, due to this hospitalization.  States her depression 6/10 today and reports experiencing increased anxiety due to  the unknown of her working situation. Reports good appetite. Reports that her constipation has  Resolved. States she was able to resting better last night with the medication change. Patient report she is excited about discharge on Monday. Support, encouragement and reassurance was provided.   Principal Problem: Bipolar I disorder, most recent episode depressed ([place]) Diagnosis:   Patient Active Problem List   Diagnosis Date Noted  . Bipolar I disorder, most recent episode depressed ([place]) [F31.30] 11/15/2015   Total Time spent with patient: 30 minutes  Past Psychiatric History: see admission H and P  Past Medical History:  Past Medical History  Diagnosis Date  . Hypertension   . Bipolar 1 disorder [facility])     Past Surgical History  Procedure Laterality Date  . C section    . Tubal ligation     Family History: History reviewed. No pertinent family history. Family Psychiatric  History: see admission H and P Social History:  History  Alcohol Use No     History  Drug Use  . Yes  . Special: Marijuana    Social History   Social History  . Marital Status: Divorced    Spouse Name: N/A  . Number of Children: N/A  . Years of Education: N/A   Social History Main  Topics  . Smoking status: Current Every Day Smoker  . Smokeless tobacco: None  . Alcohol Use: No  . Drug Use: Yes    Special: Marijuana  . Sexual Activity: Not Asked   Other Topics Concern  . None   Social History Narrative   Additional Social History:                         Sleep: Fair  Appetite:  Fair  Current Medications: Current Facility-Administered Medications  Medication Dose Route Frequency Provider Last Rate Last Dose  . acetaminophen (TYLENOL) tablet 650 mg  650 mg Oral Q6H PRN Patrecia Pour, NP      . alum & mag hydroxide-simeth (MAALOX/MYLANTA) 200-200-20 MG/5ML suspension 30 mL  30 mL Oral Q4H PRN Patrecia Pour, NP   30 mL at 11/15/15 1838  . butalbital-acetaminophen-caffeine (FIORICET, ESGIC) 50-325-40 MG per tablet 2 tablet  2 tablet Oral Q4H PRN Nicholaus Bloom, MD   2 tablet at 11/18/15 1204  . cloNIDine (CATAPRES) tablet 0.1 mg  0.1 mg Oral BID Nicholaus Bloom, MD   0.1 mg at 11/21/15 [id number]  . feeding supplement (ENSURE ENLIVE) (ENSURE ENLIVE) liquid 237 mL  237 mL Oral BID BM Nicholaus Bloom, MD   237 mL at 11/21/15 0837  . gabapentin (NEURONTIN) capsule 100 mg  100 mg Oral TID Nicholaus Bloom, MD   100 mg at 11/21/15 1201  .  hydrOXYzine (ATARAX/VISTARIL) tablet 25 mg  25 mg Oral Q6H PRN Nicholaus Bloom, MD   25 mg at 11/20/15 2130  . ibuprofen (ADVIL,MOTRIN) tablet 600 mg  600 mg Oral Q6H PRN Patrecia Pour, NP   600 mg at 11/15/15 [id number]  . lisinopril (PRINIVIL,ZESTRIL) tablet 10 mg  10 mg Oral Daily Nicholaus Bloom, MD   10 mg at 11/21/15 [id number]  . magnesium hydroxide (MILK OF MAGNESIA) suspension 30 mL  30 mL Oral Daily PRN Patrecia Pour, NP   30 mL at 11/19/15 2108  . nicotine (NICODERM CQ - dosed in mg/24 hours) patch 21 mg  21 mg Transdermal Daily Nicholaus Bloom, MD   21 mg at 11/21/15 0830  . OXcarbazepine (TRILEPTAL) tablet 150 mg  150 mg Oral BID Nicholaus Bloom, MD   150 mg at 11/21/15 [id number]  . pantoprazole (PROTONIX) EC tablet 40 mg  40 mg Oral Daily Nicholaus Bloom, MD   40 mg at 11/21/15 [id number]  . ziprasidone (GEODON) capsule 40 mg  40 mg Oral BID WC Nicholaus Bloom, MD   40 mg at 11/21/15 [id number]  . zolpidem (AMBIEN) tablet 5 mg  5 mg Oral QHS PRN Nicholaus Bloom, MD   5 mg at 11/20/15 2130    Lab Results: No results found for this or any previous visit (from the past 48 hour(s)).  Physical Findings: AIMS: Facial and Oral Movements Muscles of Facial Expression: None, normal Lips and Perioral Area: None, normal Jaw: None, normal Tongue: None, normal,Extremity Movements Upper (arms, wrists, hands, fingers): None, normal Lower (legs, knees, ankles, toes): None, normal, Trunk Movements Neck, shoulders, hips: None, normal, Overall Severity Severity of abnormal movements (highest score from questions above): None, normal Incapacitation due to abnormal movements: None, normal Patient's awareness of abnormal movements (rate only patient's report): No Awareness, Dental Status Current problems with teeth and/or dentures?: No Does patient usually wear dentures?: No  CIWA:    COWS:     Musculoskeletal: Strength & Muscle Tone: within normal limits Gait & Station: normal Patient leans: normal  Psychiatric Specialty Exam: Review of Systems  Constitutional: Negative.   HENT: Negative.   Eyes: Negative.   Respiratory: Negative.   Cardiovascular: Negative.   Gastrointestinal: Negative.   Genitourinary: Negative.   Musculoskeletal: Negative.   Skin: Negative.   Neurological: Negative.   Endo/Heme/Allergies: Negative.   Psychiatric/Behavioral: Positive for depression. Negative for suicidal ideas and hallucinations. The patient is nervous/anxious and has insomnia.   All other systems reviewed and are negative.   Blood pressure 134/100, pulse 90, temperature 99.1 F (37.3 C), temperature source Oral, resp. rate 16, height 5' 6.5" (1.689 m), weight 98.884 kg (218 lb), last menstrual period 10/27/2015.Body mass index is 34.66 kg/(m^2).  General  Appearance: ., Pleasant, clam and coopertive  Eye Contact::  Fair  Speech:  Clear and Coherent  Volume:  Normal  Mood:  Dysphoric  Affect:  Labile  Thought Process:  Coherent and Goal Directed  Orientation:  Full (Time, Place, and Person)  Thought Content:  symptoms events worries concerns  Suicidal Thoughts:  No  Homicidal Thoughts:  No  Memory:  Immediate;   Fair Recent;   Fair Remote;   Fair  Judgement:  Fair  Insight:  Present and Shallow  Psychomotor Activity:  Normal  Concentration:  Good  Recall:  [place] of Knowledge:Fair  Language: Fair  Akathisia:  No  Handed:  Right  AIMS (if indicated):  Assets:  Desire for Improvement Resilience Social Support  ADL's:  Intact  Cognition: WNL  Sleep:  Number of Hours: 6.5     I agree with current treatment plan on 11/21/2015, Patient seen face-to-face for psychiatric evaluation follow-up, chart reviewed. Reviewed the information documented and agree with the treatment plan.  Treatment Plan Summary: Daily contact with patient to assess and evaluate symptoms and progress in treatment and Medication management   Supportive approach/coping skills Mood instability: continue the Trileptal at 150 mg BID, increase the Geodon to 40 mg BID Started Ambien 5 mg for insomnia Magnesium citrated 1 bottle for constipation. Will continue to monitor vitals ,medication compliance and treatment side effects while patient is here. CSW will start working on disposition.  Patient to participate in therapeutic milieu Work with CBT/mindfulness   Derrill Center FNP- [facility] 11/21/2015, 2:38 PM  Agree with NP Progress Note, as above

## 2015-11-21 NOTE — Progress Notes (Signed)
D.  Pt pleasant on approach, denies complaints at this time.  Requested Ambien to be increased as she is having difficulty sleeping.  Positive for evening AA group with appropriate participation.  Interacting appropriately with peers on the unit.  Denies SI/HI/hallucinations at this time.  Pt to be discharged tomorrow.  A.  Support and encouragement offered  R.  Pt remains safe on the unit, will continue to monitor.

## 2015-11-21 NOTE — Progress Notes (Signed)
Patient did attend the evening speaker AA meeting.  

## 2015-11-21 NOTE — Progress Notes (Signed)
D) Pt has attended the program and interacts with her peers. Mood is brighter and affect is more spontaneous. Pt requested a 1:1 and shared "I feel foolish to say this, but I really feel scared to go home. I just don't know what is going to happen. I feel like this is my last chance to make it right". Pt went on to say that her family is very supportive and she doesn't want to mess it up. Rates her depression at a 1, hopelessness at a 0 and her anxiety at an 8. A) Pt provided a 1:1. Given support, reassurance and praise. Worked with Pt on how to look for the healthy people in her life vs the unhealthy people. R) Pt denies SI and HI. States "I want to be healthy and do everything that I need to do so that I can be healthy and go back to school"

## 2015-11-21 NOTE — BHH Group Notes (Signed)
[facility] Group Notes:  (Clinical Social Work)  11/21/2015  1:15-2:30PM  Summary of Progress/Problems:   The main focus of today's process group was to   1)  discuss the importance of adding supports  2)  define health supports versus unhealthy supports  3)  identify the patient's current unhealthy supports and plan how to handle them  4)  Identify the patient's current healthy supports and plan what to add.  An emphasis was placed on using counselor, doctor, therapy groups, 12-step groups, and problem-specific support groups to expand supports.    The patient stated that she has employment/jobs but always has a hard time keeping them because of her temper.  She needs support to enable her to get reasonable accommodations.  She left the room and did not return.  Type of Therapy:  Process Group with Motivational Interviewing  Participation Level:  Minimal  Participation Quality:  Attentive  Affect:  Appropriate  Cognitive:  Appropriate  Insight:  Developing/Improving  Engagement in Therapy:  Engaged  Modes of Intervention:   Education, Support and Processing, Activity  Selmer Dominion, LCSW 11/21/2015

## 2015-11-21 NOTE — BHH Group Notes (Signed)
[place] Group Notes:  (Nursing/MHT/Case Management/Adjunct)  Date:  11/21/2015  Time:  1000  Type of Therapy:  Nurse Education  Life Skills : The group is focused on teaching patients  How to develop healthy support systems.   Participation Level:  Active  Participation Quality:  Appropriate  Affect:  Appropriate  Cognitive:  Alert  Insight:  Appropriate  Engagement in Group:  Engaged  Modes of Intervention:  Education  Summary of Progress/Problems:  Barbara Ellis 11/21/2015, 12:26 PM

## 2015-11-22 MED ORDER — GABAPENTIN 100 MG PO CAPS: 100.0000 mg | ORAL_CAPSULE | Freq: Three times a day (TID) | ORAL | Status: DC

## 2015-11-22 MED ORDER — HYDROXYZINE HCL 25 MG PO TABS: 25.0000 mg | ORAL_TABLET | Freq: Four times a day (QID) | ORAL | Status: DC | PRN

## 2015-11-22 MED ORDER — ZOLPIDEM TARTRATE 5 MG PO TABS: 5.0000 mg | ORAL_TABLET | Freq: Every evening | ORAL | Status: DC | PRN

## 2015-11-22 MED ORDER — OXCARBAZEPINE 150 MG PO TABS: 150.0000 mg | ORAL_TABLET | Freq: Two times a day (BID) | ORAL | Status: DC

## 2015-11-22 MED ORDER — LISINOPRIL 10 MG PO TABS: 10.0000 mg | ORAL_TABLET | Freq: Every day | ORAL | Status: DC

## 2015-11-22 MED ORDER — CLONIDINE HCL 0.1 MG PO TABS: 0.1000 mg | ORAL_TABLET | Freq: Two times a day (BID) | ORAL | Status: AC

## 2015-11-22 MED ORDER — NYSTATIN 100000 UNIT/GM EX POWD
Freq: Two times a day (BID) | CUTANEOUS | Status: DC
  Filled 2015-11-22: qty 15

## 2015-11-22 MED ORDER — ZIPRASIDONE HCL 40 MG PO CAPS: 40.0000 mg | ORAL_CAPSULE | Freq: Two times a day (BID) | ORAL | Status: DC

## 2015-11-22 MED ORDER — PANTOPRAZOLE SODIUM 40 MG PO TBEC: 40.0000 mg | DELAYED_RELEASE_TABLET | Freq: Every day | ORAL | Status: DC

## 2015-11-22 NOTE — Progress Notes (Signed)
Pt attended spiritual care group on grief and loss facilitated by chaplain Jerene Pitch  Group opened with brief discussion and psycho-social ed around grief and loss in relationships and in relation to self - identifying life patterns, circumstances, changes that cause losses. Established group norm of speaking from own life experience. Group goal of establishing open and affirming space for members to share loss and experience with grief, normalize grief experience and provide psycho social education and grief support.    Barbara Ellis was present at beginning of group at 10:00   She was called out of group and discharged prior to end of group.      [place], [place]

## 2015-11-22 NOTE — Progress Notes (Signed)
Patient ID: Barbara Ellis, female   DOB: 09-21-1971, 45 y.o.   MRN: [id number]  Pt. Denies SI/HI and A/V hallucinations. Belongings returned to patient at time of discharge. Patient denies any new onset of pain or discomfort. Discharge instructions and medications were reviewed with patient as well as [facility] transition record. Per MD LUgo patient was given address and phone number to [place] and wellness to follow up with her MRI and make an appointment with a neurologist. Patient verbalized understanding of both medications and discharge instructions. Patient was discharged to lobby with no distress. Q15 minute safety checks maintained until discharge.

## 2015-11-22 NOTE — Progress Notes (Signed)
  [facility] Adult Case Management Discharge Plan :  Will you be returning to the same living situation after discharge:  No.Pt plans to go to [facility] for placement at d/c.  At discharge, do you have transportation home?: Yes,  $5 gas money provided to pt for her to drive to [facility] Do you have the ability to pay for your medications: Yes,  mental health  Release of information consent forms completed and submitted to medical records by CSW.  Patient to Follow up at: Follow-up Information    Follow [facility] with [facility] [place].   Why:  Walk in between 8am-12pm Monday through Friday for assessment (medication management/counseling).    Contact information:   [address], [place] [address] Phone: [phone] Fax: [phone]      Follow up with [facility] On 11/23/2015.   Why:  Appt on this date at 2:00PM with Verlin Grills for counseling. Please call to reschedule or cancel within 48 hours of appt if necessary.    Contact information:   [facility] [address] [place] [address] Phone: [phone] Fax: [phone]      Follow up with [facility]).   Why:  Please arrive by 11:00AM to be seen by Tiffany or another peer support counselor for assessment. Glennis Brink [phone]).    Contact information:   [address], [place] [address] Phone: [phone] Fax: X      Next level of care provider has access to [place] and Suicide Prevention discussed: Yes,  SPE completed with pt's sister. SPI pamphlet and mobile crisis information provided to pt.   Have you used any form of tobacco in the last 30 days? (Cigarettes, Smokeless Tobacco, Cigars, and/or Pipes): Yes  Has patient been referred to the Quitline?: Patient refused referral  Patient has been referred for addiction treatment: N/A  Smart, Sherial Ebrahim LCSW 11/22/2015, 9:46 AM

## 2015-11-22 NOTE — Progress Notes (Signed)
Pt reported rash in skin fold under abdomen.  Pt states that NP was to order something for it yesterday.  Notified Arlester Marker, NP this morning once it was reported to me and she would like it examined by NP this morning before anything is ordered so that medication will be correct.
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
## 2015-11-22 NOTE — Plan of Care (Signed)
Problem: Diagnosis: Increased Risk For Suicide Attempt Goal: STG-Patient Will Attend All Groups On The Unit Outcome: Progressing Pt has attended group this weekend

## 2015-11-22 NOTE — Discharge Summary (Signed)
Physician Discharge Summary Note  Patient:  Barbara [facility] is an 45 y.o., female MRN:  [id number] DOB:  04/05/1971 Patient phone:  [phone] (home)  Patient address:   [place] [address],  Total Time spent with patient: 30 minutes  Date of Admission:  11/14/2015 Date of Discharge: 11/22/2015  Reason for Admission: PER History of Present Illness:: 46 Y/O female who states she has been homeless for the last year year. Mother died 11-22-2013. States she was her life. She has not been able to move on. States she was going to kill herself by OD in the cemetery at her mother's tomb but she text a friend implying what she was going to do as she wanted her friend to pick up some things she had of her. Her friend got in touch with her sister so she changed the plan and drove to the [facility] ED parked her car at [facility] and took an OD of Trazodone. Woke up states she thought God had another plan for her. She endorses persistent depression. She also endorses episodes of mood swings with irritability anger impulsive behavior not triggered not related to substance abuse.  Principal Problem: Bipolar I disorder, most recent episode depressed [facility]) Discharge Diagnoses: Patient Active Problem List   Diagnosis Date Noted  . Bipolar I disorder, most recent episode depressed ([facility]) [F31.30] 11/15/2015      Past Medical History:  Past Medical History  Diagnosis Date  . Hypertension   . Bipolar 1 disorder [facility])     Past Surgical History  Procedure Laterality Date  . C section    . Tubal ligation     Family History: History reviewed. No pertinent family history.  Social History:  History  Alcohol Use No     History  Drug Use  . Yes  . Special: Marijuana    Social History   Social History  . Marital Status: Divorced    Spouse Name: N/A  . Number of Children: N/A  . Years of Education: N/A   Social History Main Topics  . Smoking status: Current Every Day Smoker  .  Smokeless tobacco: None  . Alcohol Use: No  . Drug Use: Yes    Special: Marijuana  . Sexual Activity: Not Asked   Other Topics Concern  . None   Social History Narrative    [facility] Course:  Barbara Ellis was admitted for Bipolar I disorder, most recent episode depressed ([facility]) , without psychosis and crisis management.  Pt was treated discharged with the medications listed below under Medication List.  Medical problems were identified and treated as needed.  Home medications were restarted as appropriate.  Improvement was monitored by observation and [facility] 's daily report of symptom reduction.  Emotional and mental status was monitored by daily self-inventory reports completed by Barbara Ellis and clinical staff.         Barbara Ellis was evaluated by the treatment team for stability and plans for continued recovery upon discharge. Barbara Ellis 's motivation was an integral factor for scheduling further treatment. Employment, transportation, bed availability, health status, family support, and any pending legal issues were also considered during Ellis stay. Pt was offered further treatment options upon discharge including but not limited to Residential, Intensive Outpatient, and Outpatient treatment.  Barbara Ellis will follow up with the services as listed below under Follow Up Information.     Upon completion of this admission the patient was both mentally and medically stable for  discharge denying suicidal/homicidal ideation, auditory/visual/tactile hallucinations, delusional thoughts and paranoia.      Barbara Ellis responded well to treatment with trileptal 150, Geodon 40 mg without adverse effects. Initially, p. Pt demonstrated improvement without reported or observed adverse effects to the point of stability appropriate for outpatient management. Pertinent labs include: HDL 39 (l) LDL 135 (h) , for which outpatient follow-up is necessary for lab recheck as mentioned below.  Reviewed CBC, CMP, BAL, and UDS+ THC; all unremarkable aside from noted exceptions.   Physical Findings: AIMS: Facial and Oral Movements Muscles of Facial Expression: None, normal Lips and Perioral Area: None, normal Jaw: None, normal Tongue: None, normal,Extremity Movements Upper (arms, wrists, hands, fingers): None, normal Lower (legs, knees, ankles, toes): None, normal, Trunk Movements Neck, shoulders, hips: None, normal, Overall Severity Severity of abnormal movements (highest score from questions above): None, normal Incapacitation due to abnormal movements: None, normal Patient's awareness of abnormal movements (rate only patient's report): No Awareness, Dental Status Current problems with teeth and/or dentures?: No Does patient usually wear dentures?: No  CIWA:    COWS:     Musculoskeletal: Strength & Muscle Tone: within normal limits Gait & Station: normal Patient leans: N/A  Psychiatric Specialty Exam: SEE SRA BY MD Review of Systems  Psychiatric/Behavioral: Negative for suicidal ideas and hallucinations. Depression: stable. Nervous/anxious: stable.   All other systems reviewed and are negative.   Blood pressure 147/96, pulse 91, temperature 98.4 F (36.9 C), temperature source Oral, resp. rate 20, height 5' 6.5" (1.689 m), weight 98.884 kg (218 lb), last menstrual period 10/27/2015.Body mass index is 34.66 kg/(m^2).  Have you used any form of tobacco in the last 30 days? (Cigarettes, Smokeless Tobacco, Cigars, and/or Pipes): Yes  Has this patient used any form of tobacco in the last 30 days? (Cigarettes, Smokeless Tobacco, Cigars, and/or Pipes) , No  Metabolic Disorder Labs:  Lab Results  Component Value Date   HGBA1C 5.5 11/17/2015   MPG 111 11/17/2015   No results found for: PROLACTIN Lab Results  Component Value Date   CHOL 198 11/17/2015   TRIG 119 11/17/2015   HDL 39* 11/17/2015   CHOLHDL 5.1 11/17/2015   VLDL 24 11/17/2015   LDLCALC 135* 11/17/2015     See Psychiatric Specialty Exam and Suicide Risk Assessment completed by Attending Physician prior to discharge.  Discharge destination:  Home  Is patient on multiple antipsychotic therapies at discharge:  No   Has Patient had three or more failed trials of antipsychotic monotherapy by history:  No  Recommended Plan for Multiple Antipsychotic Therapies: NA  Discharge Instructions    Activity as tolerated - No restrictions    Complete by:  As directed      Diet general    Complete by:  As directed      Discharge instructions    Complete by:  As directed   Take all medications as prescribed. Keep all follow-up appointments as scheduled.  Do not consume alcohol or use illegal drugs while on prescription medications. Report any adverse effects from your medications to your primary care provider promptly.  In the event of recurrent symptoms or worsening symptoms, call 911, a crisis hotline, or go to the nearest emergency department for evaluation.            Medication List    STOP taking these medications        ibuprofen 800 MG tablet  Commonly known as:  ADVIL,MOTRIN     traZODone 100 MG tablet  Commonly known as:  DESYREL      TAKE these medications      Indication   cloNIDine 0.1 MG tablet  Commonly known as:  CATAPRES  Take 1 tablet (0.1 mg total) by mouth 2 (two) times daily.   Indication:  High Blood Pressure     gabapentin 100 MG capsule  Commonly known as:  NEURONTIN  Take 1 capsule (100 mg total) by mouth 3 (three) times daily.      hydrOXYzine 25 MG tablet  Commonly known as:  ATARAX/VISTARIL  Take 1 tablet (25 mg total) by mouth every 6 (six) hours as needed for anxiety.   Indication:  Anxiety Neurosis     lisinopril 10 MG tablet  Commonly known as:  PRINIVIL,ZESTRIL  Take 1 tablet (10 mg total) by mouth daily.   Indication:  High Blood Pressure     OXcarbazepine 150 MG tablet  Commonly known as:  TRILEPTAL  Take 1 tablet (150 mg total) by  mouth 2 (two) times daily.   Indication:  Manic-Depression     pantoprazole 40 MG tablet  Commonly known as:  PROTONIX  Take 1 tablet (40 mg total) by mouth daily.   Indication:  GERD     ziprasidone 40 MG capsule  Commonly known as:  GEODON  Take 1 capsule (40 mg total) by mouth 2 (two) times daily with a meal.   Indication:  mood stabilization     zolpidem 5 MG tablet  Commonly known as:  AMBIEN  Take 1 tablet (5 mg total) by mouth at bedtime as needed for sleep.   Indication:  Trouble Sleeping           Follow-up Information    Follow up with [facility].   Why:  Walk in between 8am-12pm Monday through Friday for assessment (medication management/counseling).    Contact information:   [address] [place] [address] Phone: [phone] Fax: [phone]      Follow up with [facility] On 11/23/2015.   Why:  Appt on this date at 2:00PM with Verlin Grills for counseling. Please call to reschedule or cancel within 48 hours of appt if necessary.    Contact information:   [facility] [address], [place] [address] Phone: [phone] Fax: [phone]      Follow up with [facility]).   Why:  Please arrive by 11:00AM to be seen by Tiffany or another peer support counselor for assessment. Glennis Brink [phone]).    Contact information:   [address], [place] [address] Phone: [phone] Fax: X      Follow-up recommendations:  Activity:  as tolerated Diet:  heart healthy  Comments: Take all medications as prescribed. Keep all follow-up appointments as scheduled.  Do not consume alcohol or use illegal drugs while on prescription medications. Report any adverse effects from your medications to your primary care provider promptly.  In the event of recurrent symptoms or worsening symptoms, call 911, a crisis hotline, or go to the nearest emergency department  for evaluation.   Signed: Derrill Center FNP- BC 11/22/2015, 9:03 AM  I agree with assessment and plan Woodroe Chen. Sabra Heck, M.D.

## 2016-03-15 ENCOUNTER — Inpatient Hospital Stay (HOSPITAL_COMMUNITY)
Admission: AD | Admit: 2016-03-15 | Discharge: 2016-03-15 | Disposition: A | Discharge status: Home / Self Care | Payer: Self-pay | Source: Ambulatory Visit | Attending: Obstetrics and Gynecology | Admitting: Obstetrics and Gynecology

## 2016-03-15 ENCOUNTER — Encounter (HOSPITAL_COMMUNITY): Payer: Self-pay | Admitting: *Deleted

## 2016-03-15 DIAGNOSIS — R319 Hematuria, unspecified: Secondary | ICD-10-CM | POA: Insufficient documentation

## 2016-03-15 DIAGNOSIS — A5901 Trichomonal vulvovaginitis: Secondary | ICD-10-CM | POA: Insufficient documentation

## 2016-03-15 DIAGNOSIS — F17210 Nicotine dependence, cigarettes, uncomplicated: Secondary | ICD-10-CM | POA: Insufficient documentation

## 2016-03-15 DIAGNOSIS — R109 Unspecified abdominal pain: Secondary | ICD-10-CM | POA: Insufficient documentation

## 2016-03-15 DIAGNOSIS — B9689 Other specified bacterial agents as the cause of diseases classified elsewhere: Secondary | ICD-10-CM

## 2016-03-15 DIAGNOSIS — F319 Bipolar disorder, unspecified: Secondary | ICD-10-CM | POA: Insufficient documentation

## 2016-03-15 DIAGNOSIS — M549 Dorsalgia, unspecified: Secondary | ICD-10-CM | POA: Insufficient documentation

## 2016-03-15 DIAGNOSIS — N760 Acute vaginitis: Secondary | ICD-10-CM | POA: Insufficient documentation

## 2016-03-15 DIAGNOSIS — I10 Essential (primary) hypertension: Secondary | ICD-10-CM | POA: Insufficient documentation

## 2016-03-15 LAB — POCT PREGNANCY, URINE: Preg Test, Ur: NEGATIVE

## 2016-03-15 LAB — URINALYSIS, ROUTINE W REFLEX MICROSCOPIC
BILIRUBIN URINE: NEGATIVE
GLUCOSE, UA: NEGATIVE mg/dL
KETONES UR: NEGATIVE mg/dL
Leukocytes, UA: NEGATIVE
Nitrite: NEGATIVE
PROTEIN: NEGATIVE mg/dL
Specific Gravity, Urine: >1.03 — ABNORMAL HIGH (ref 1.005–1.030)
pH: 5.5 (ref 5.0–8.0)

## 2016-03-15 LAB — URINE MICROSCOPIC-ADD ON: WBC UA: NONE SEEN WBC/hpf (ref 0–5)

## 2016-03-15 LAB — WET PREP, GENITAL
Sperm: NONE SEEN
Yeast Wet Prep HPF POC: NONE SEEN

## 2016-03-15 MED ORDER — ONDANSETRON HCL 4 MG PO TABS
8.0000 mg | ORAL_TABLET | Freq: Once | ORAL | Status: AC
  Administered 2016-03-15: 8 mg via ORAL
  Filled 2016-03-15: qty 2

## 2016-03-15 MED ORDER — METRONIDAZOLE 500 MG PO TABS: 500.0000 mg | ORAL_TABLET | Freq: Two times a day (BID) | ORAL | Status: DC

## 2016-03-15 MED ORDER — METRONIDAZOLE 500 MG PO TABS
2000.0000 mg | ORAL_TABLET | Freq: Once | ORAL | Status: AC
  Administered 2016-03-15: 2000 mg via ORAL
  Filled 2016-03-15: qty 4

## 2016-03-15 NOTE — MAU Note (Signed)
Pt states her period is 3 weeks late, having lower abd & back pain x 3-4 weeks, breasts are tender.  Did HPT last week, was negative.  BTL 18 years ago.  Denies vaginal bleeding.

## 2016-03-15 NOTE — MAU Provider Note (Signed)
History     CSN: [id number]  Arrival date and time: 03/15/16 1046   First Provider Initiated Contact with Patient 03/15/16 1226      Chief Complaint  Patient presents with  . Abdominal Pain  . Back Pain   HPI Zianna Parella 45 y.o. [id number] presents for missed cycle x 3 weeks; lower abdominal pain than wraps around towards her back.  Past Medical History  Diagnosis Date  . Hypertension   . Bipolar 1 disorder [facility])     Past Surgical History  Procedure Laterality Date  . C section    . Tubal ligation    . Cesarean section    . Tubal ligation  1995    History reviewed. No pertinent family history.  Social History  Substance Use Topics  . Smoking status: Current Every Day Smoker -- 1.00 packs/day    Types: Cigarettes  . Smokeless tobacco: Never Used  . Alcohol Use: No    Allergies:  Allergies  Allergen Reactions  . Sulfa Antibiotics Hives    Prescriptions prior to admission  Medication Sig Dispense Refill Last Dose  . cloNIDine (CATAPRES) 0.1 MG tablet Take 1 tablet (0.1 mg total) by mouth 2 (two) times daily. 60 tablet 11   . gabapentin (NEURONTIN) 100 MG capsule Take 1 capsule (100 mg total) by mouth 3 (three) times daily. 60 capsule 0   . hydrOXYzine (ATARAX/VISTARIL) 25 MG tablet Take 1 tablet (25 mg total) by mouth every 6 (six) hours as needed for anxiety. 20 tablet 0   . lisinopril (PRINIVIL,ZESTRIL) 10 MG tablet Take 1 tablet (10 mg total) by mouth daily. 30 tablet 0   . OXcarbazepine (TRILEPTAL) 150 MG tablet Take 1 tablet (150 mg total) by mouth 2 (two) times daily. 30 tablet 0   . pantoprazole (PROTONIX) 40 MG tablet Take 1 tablet (40 mg total) by mouth daily. 30 tablet 0   . ziprasidone (GEODON) 40 MG capsule Take 1 capsule (40 mg total) by mouth 2 (two) times daily with a meal. 60 capsule 0   . zolpidem (AMBIEN) 5 MG tablet Take 1 tablet (5 mg total) by mouth at bedtime as needed for sleep. 20 tablet 0     Review of Systems  Constitutional: Negative  for fever.  Gastrointestinal: Positive for abdominal pain.  Genitourinary:       Missed period x 3 weeks  Musculoskeletal: Positive for back pain.  All other systems reviewed and are negative.  Physical Exam   Blood pressure 135/82, pulse 78, temperature 98.1 F (36.7 C), temperature source Oral, resp. rate 18, height 5\' 8"  (1.727 m), weight 232 lb (105.235 kg), last menstrual period 01/30/2016.  Physical Exam  Nursing note and vitals reviewed. Constitutional: She appears well-developed and well-nourished. No distress.  HENT:  Head: Normocephalic and atraumatic.  Cardiovascular: Normal rate and regular rhythm.   Respiratory: Effort normal and breath sounds normal. No respiratory distress.   Results for orders placed or performed during the hospital encounter of 03/15/16 (from the past 24 hour(s))  Urinalysis, Routine w reflex microscopic (not at [facility])     Status: Abnormal   Collection Time: 03/15/16 11:05 AM  Result Value Ref Range   Color, Urine YELLOW YELLOW   APPearance CLEAR CLEAR   Specific Gravity, Urine >1.030 (H) 1.005 - 1.030   pH 5.5 5.0 - 8.0   Glucose, UA NEGATIVE NEGATIVE mg/dL   Hgb urine dipstick LARGE (A) NEGATIVE   Bilirubin Urine NEGATIVE NEGATIVE   Ketones, ur NEGATIVE  NEGATIVE mg/dL   Protein, ur NEGATIVE NEGATIVE mg/dL   Nitrite NEGATIVE NEGATIVE   Leukocytes, UA NEGATIVE NEGATIVE  Urine microscopic-add on     Status: Abnormal   Collection Time: 03/15/16 11:05 AM  Result Value Ref Range   Squamous Epithelial / LPF 0-5 (A) NONE SEEN   WBC, UA NONE SEEN 0 - 5 WBC/hpf   RBC / HPF 0-5 0 - 5 RBC/hpf   Bacteria, UA RARE (A) NONE SEEN   Urine-Other MUCOUS PRESENT   Pregnancy, urine POC     Status: None   Collection Time: 03/15/16 11:52 AM  Result Value Ref Range   Preg Test, Ur NEGATIVE NEGATIVE  Wet prep, genital     Status: Abnormal   Collection Time: 03/15/16 12:36 PM  Result Value Ref Range   Yeast Wet Prep HPF POC NONE SEEN NONE SEEN   Trich, Wet  Prep PRESENT (A) NONE SEEN   Clue Cells Wet Prep HPF POC PRESENT (A) NONE SEEN   WBC, Wet Prep HPF POC FEW (A) NONE SEEN   Sperm NONE SEEN     MAU Course  Procedures  MDM Pt has trichomonas and BV. Expedited partner therapy given. Will treat with Flagyl 2 gms PO here in MAU RX for flagyl 500 mg PO BID x 7 days. Blood in the urine . Will send urine off for culture  Assessment and Plan  Trichomonas Bacterial Vaginitis Hematuria  Flagyl 500 mg PO BID x 7 days  Discharge  Dierre Crevier Grissett 03/15/2016, 12:56 PM

## 2016-03-15 NOTE — Discharge Instructions (Signed)
Bacterial Vaginosis Bacterial vaginosis is a vaginal infection that occurs when the normal balance of bacteria in the vagina is disrupted. It results from an overgrowth of certain bacteria. This is the most common vaginal infection in women of childbearing age. Treatment is important to prevent complications, especially in pregnant women, as it can cause a premature delivery. CAUSES  Bacterial vaginosis is caused by an increase in harmful bacteria that are normally present in smaller amounts in the vagina. Several different kinds of bacteria can cause bacterial vaginosis. However, the reason that the condition develops is not fully understood. RISK FACTORS Certain activities or behaviors can put you at an increased risk of developing bacterial vaginosis, including:  Having a new sex partner or multiple sex partners.  Douching.  Using an intrauterine device (IUD) for contraception. Women do not get bacterial vaginosis from toilet seats, bedding, swimming pools, or contact with objects around them. SIGNS AND SYMPTOMS  Some women with bacterial vaginosis have no signs or symptoms. Common symptoms include:  Grey vaginal discharge.  A fishlike odor with discharge, especially after sexual intercourse.  Itching or burning of the vagina and vulva.  Burning or pain with urination. DIAGNOSIS  Your health care provider will take a medical history and examine the vagina for signs of bacterial vaginosis. A sample of vaginal fluid may be taken. Your health care provider will look at this sample under a microscope to check for bacteria and abnormal cells. A vaginal pH test may also be done.  TREATMENT  Bacterial vaginosis may be treated with antibiotic medicines. These may be given in the form of a pill or a vaginal cream. A second round of antibiotics may be prescribed if the condition comes back after treatment. Because bacterial vaginosis increases your risk for sexually transmitted diseases, getting  treated can help reduce your risk for chlamydia, gonorrhea, HIV, and herpes. HOME CARE INSTRUCTIONS   Only take over-the-counter or prescription medicines as directed by your health care provider.  If antibiotic medicine was prescribed, take it as directed. Make sure you finish it even if you start to feel better.  Tell all sexual partners that you have a vaginal infection. They should see their health care provider and be treated if they have problems, such as a mild rash or itching.  During treatment, it is important that you follow these instructions:  Avoid sexual activity or use condoms correctly.  Do not douche.  Avoid alcohol as directed by your health care provider.  Avoid breastfeeding as directed by your health care provider. SEEK MEDICAL CARE IF:   Your symptoms are not improving after 3 days of treatment.  You have increased discharge or pain.  You have a fever. MAKE SURE YOU:   Understand these instructions.  Will watch your condition.  Will get help right away if you are not doing well or get worse. FOR MORE INFORMATION  Centers for Disease Control and Prevention, Division of STD Prevention: [employer]raud[employer] American Sexual Health Association (ASHA): www.ashastd.org    This information is not intended to replace advice given to you by your health care provider. Make sure you discuss any questions you have with your health care provider.   Document Released: 10/16/2005 Document Revised: 11/06/2014 Document Reviewed: 05/28/2013 Elsevier Interactive Patient Education 2016 [employer]. Trichomoniasis Trichomoniasis is an infection caused by an organism called Trichomonas. The infection can affect both women and men. In women, the outer female genitalia and the vagina are affected. In men, the penis is mainly affected,  but the prostate and other reproductive organs can also be involved. Trichomoniasis is a sexually transmitted infection (STI) and is most often  passed to another person through sexual contact.  RISK FACTORS  Having unprotected sexual intercourse.  Having sexual intercourse with an infected partner. SIGNS AND SYMPTOMS  Symptoms of trichomoniasis in women include:  Abnormal gray-green frothy vaginal discharge.  Itching and irritation of the vagina.  Itching and irritation of the area outside the vagina. Symptoms of trichomoniasis in men include:   Penile discharge with or without pain.  Pain during urination. This results from inflammation of the urethra. DIAGNOSIS  Trichomoniasis may be found during a Pap test or physical exam. Your health care provider may use one of the following methods to help diagnose this infection:  Testing the pH of the vagina with a test tape.  Using a vaginal swab test that checks for the Trichomonas organism. A test is available that provides results within a few minutes.  Examining a urine sample.  Testing vaginal secretions. Your health care provider may test you for other STIs, including HIV. TREATMENT   You may be given medicine to fight the infection. Women should inform their health care provider if they could be or are pregnant. Some medicines used to treat the infection should not be taken during pregnancy.  Your health care provider may recommend over-the-counter medicines or creams to decrease itching or irritation.  Your sexual partner will need to be treated if infected.  Your health care provider may test you for infection again 3 months after treatment. HOME CARE INSTRUCTIONS   Take medicines only as directed by your health care provider.  Take over-the-counter medicine for itching or irritation as directed by your health care provider.  Do not have sexual intercourse while you have the infection.  Women should not douche or wear tampons while they have the infection.  Discuss your infection with your partner. Your partner may have gotten the infection from you, or you  may have gotten it from your partner.  Have your sex partner get examined and treated if necessary.  Practice safe, informed, and protected sex.  See your health care provider for other STI testing. SEEK MEDICAL CARE IF:   You still have symptoms after you finish your medicine.  You develop abdominal pain.  You have pain when you urinate.  You have bleeding after sexual intercourse.  You develop a rash.  Your medicine makes you sick or makes you throw up (vomit). MAKE SURE YOU:  Understand these instructions.  Will watch your condition.  Will get help right away if you are not doing well or get worse.   This information is not intended to replace advice given to you by your health care provider. Make sure you discuss any questions you have with your health care provider.   Document Released: 04/11/2001 Document Revised: 11/06/2014 Document Reviewed: 07/28/2013 Elsevier Interactive Patient Education [employer].

## 2016-12-27 ENCOUNTER — Emergency Department (HOSPITAL_COMMUNITY)
Admission: EM | Admit: 2016-12-27 | Discharge: 2016-12-28 | Disposition: A | Discharge status: Home / Self Care | Payer: No Typology Code available for payment source | Attending: Emergency Medicine | Admitting: Emergency Medicine

## 2016-12-27 ENCOUNTER — Encounter (HOSPITAL_COMMUNITY): Payer: Self-pay

## 2016-12-27 DIAGNOSIS — R51 Headache: Secondary | ICD-10-CM | POA: Insufficient documentation

## 2016-12-27 DIAGNOSIS — R519 Headache, unspecified: Secondary | ICD-10-CM

## 2016-12-27 DIAGNOSIS — I10 Essential (primary) hypertension: Secondary | ICD-10-CM | POA: Insufficient documentation

## 2016-12-27 DIAGNOSIS — Z5181 Encounter for therapeutic drug level monitoring: Secondary | ICD-10-CM | POA: Insufficient documentation

## 2016-12-27 DIAGNOSIS — F315 Bipolar disorder, current episode depressed, severe, with psychotic features: Secondary | ICD-10-CM | POA: Insufficient documentation

## 2016-12-27 DIAGNOSIS — F3130 Bipolar disorder, current episode depressed, mild or moderate severity, unspecified: Secondary | ICD-10-CM | POA: Diagnosis present

## 2016-12-27 DIAGNOSIS — F17210 Nicotine dependence, cigarettes, uncomplicated: Secondary | ICD-10-CM | POA: Insufficient documentation

## 2016-12-27 LAB — CBC
HCT: 43.7 % (ref 36.0–46.0)
Hemoglobin: 15.4 g/dL — ABNORMAL HIGH (ref 12.0–15.0)
MCH: 32.4 pg (ref 26.0–34.0)
MCHC: 35.2 g/dL (ref 30.0–36.0)
MCV: 91.8 fL (ref 78.0–100.0)
Platelets: 304 10*3/uL (ref 150–400)
RBC: 4.76 MIL/uL (ref 3.87–5.11)
RDW: 13 % (ref 11.5–15.5)
WBC: 14.2 10*3/uL — ABNORMAL HIGH (ref 4.0–10.5)

## 2016-12-27 LAB — RAPID URINE DRUG SCREEN, HOSP PERFORMED
Amphetamines: NOT DETECTED
Barbiturates: NOT DETECTED
Benzodiazepines: NOT DETECTED
COCAINE: NOT DETECTED
OPIATES: NOT DETECTED
Tetrahydrocannabinol: POSITIVE — AB

## 2016-12-27 LAB — COMPREHENSIVE METABOLIC PANEL
ALK PHOS: 55 U/L (ref 38–126)
ALT: 14 U/L (ref 14–54)
ANION GAP: 9 (ref 5–15)
AST: 14 U/L — ABNORMAL LOW (ref 15–41)
Albumin: 4 g/dL (ref 3.5–5.0)
BILIRUBIN TOTAL: 0.6 mg/dL (ref 0.3–1.2)
BUN: 7 mg/dL (ref 6–20)
CALCIUM: 8.9 mg/dL (ref 8.9–10.3)
CO2: 21 mmol/L — ABNORMAL LOW (ref 22–32)
Chloride: 108 mmol/L (ref 101–111)
Creatinine, Ser: 0.74 mg/dL (ref 0.44–1.00)
Glucose, Bld: 92 mg/dL (ref 65–99)
Potassium: 3.5 mmol/L (ref 3.5–5.1)
SODIUM: 138 mmol/L (ref 135–145)
TOTAL PROTEIN: 7.3 g/dL (ref 6.5–8.1)

## 2016-12-27 LAB — ACETAMINOPHEN LEVEL

## 2016-12-27 LAB — ETHANOL

## 2016-12-27 LAB — SALICYLATE LEVEL

## 2016-12-27 MED ORDER — IBUPROFEN 200 MG PO TABS
600.0000 mg | ORAL_TABLET | Freq: Three times a day (TID) | ORAL | Status: DC | PRN
  Administered 2016-12-27: 600 mg via ORAL
  Filled 2016-12-27: qty 3

## 2016-12-27 MED ORDER — ACETAMINOPHEN 325 MG PO TABS: 650.0000 mg | ORAL_TABLET | ORAL | Status: DC | PRN

## 2016-12-27 MED ORDER — SODIUM CHLORIDE 0.9 % IV BOLUS (SEPSIS)
1000.0000 mL | Freq: Once | INTRAVENOUS | Status: AC
  Administered 2016-12-27: 1000 mL via INTRAVENOUS

## 2016-12-27 MED ORDER — HYDRALAZINE HCL 20 MG/ML IJ SOLN: 10.0000 mg | INTRAMUSCULAR | Status: DC

## 2016-12-27 MED ORDER — ALUM & MAG HYDROXIDE-SIMETH 200-200-20 MG/5ML PO SUSP: 30.0000 mL | ORAL | Status: DC | PRN

## 2016-12-27 MED ORDER — THIAMINE HCL 100 MG/ML IJ SOLN
100.0000 mg | Freq: Once | INTRAMUSCULAR | Status: AC
  Administered 2016-12-27: 100 mg via INTRAVENOUS
  Filled 2016-12-27: qty 2

## 2016-12-27 MED ORDER — DIPHENHYDRAMINE HCL 50 MG/ML IJ SOLN
12.5000 mg | Freq: Once | INTRAMUSCULAR | Status: AC
  Administered 2016-12-27: 12.5 mg via INTRAVENOUS
  Filled 2016-12-27: qty 1

## 2016-12-27 MED ORDER — ONDANSETRON HCL 4 MG PO TABS
4.0000 mg | ORAL_TABLET | Freq: Three times a day (TID) | ORAL | Status: DC | PRN
Start: 2016-12-27 — End: 2016-12-28

## 2016-12-27 MED ORDER — ZOLPIDEM TARTRATE 5 MG PO TABS: 5.0000 mg | ORAL_TABLET | Freq: Every evening | ORAL | Status: DC | PRN

## 2016-12-27 MED ORDER — GABAPENTIN 100 MG PO CAPS
100.0000 mg | ORAL_CAPSULE | Freq: Three times a day (TID) | ORAL | Status: DC
  Administered 2016-12-27 – 2016-12-28 (×3): 100 mg via ORAL
  Filled 2016-12-27 (×3): qty 1

## 2016-12-27 MED ORDER — ASPIRIN-ACETAMINOPHEN-CAFFEINE 250-250-65 MG PO TABS
2.0000 | ORAL_TABLET | Freq: Once | ORAL | Status: AC
  Administered 2016-12-27: 2 via ORAL
  Filled 2016-12-27: qty 2

## 2016-12-27 MED ORDER — CLONIDINE HCL 0.1 MG PO TABS
0.1000 mg | ORAL_TABLET | Freq: Two times a day (BID) | ORAL | Status: DC
  Administered 2016-12-27 – 2016-12-28 (×3): 0.1 mg via ORAL
  Filled 2016-12-27 (×3): qty 1

## 2016-12-27 MED ORDER — DEXAMETHASONE SODIUM PHOSPHATE 10 MG/ML IJ SOLN
10.0000 mg | Freq: Once | INTRAMUSCULAR | Status: AC
  Administered 2016-12-27: 10 mg via INTRAVENOUS
  Filled 2016-12-27: qty 1

## 2016-12-27 MED ORDER — PROCHLORPERAZINE EDISYLATE 5 MG/ML IJ SOLN
5.0000 mg | Freq: Once | INTRAMUSCULAR | Status: DC
  Filled 2016-12-27: qty 1

## 2016-12-27 NOTE — ED Notes (Signed)
Wanded by security 

## 2016-12-27 NOTE — ED Notes (Signed)
Bed: [facility] Expected date:  Expected time:  Means of arrival:  Comments: 

## 2016-12-27 NOTE — Progress Notes (Signed)
12/27/16  1650  Notified Abigail, PA that patient c/o headache starting to come back again. Ibuprofen given for headache. Pt states she can not take Tylenol.

## 2016-12-27 NOTE — ED Provider Notes (Signed)
[facility] DEPT Provider Note   CSN: [id number] Arrival date & time: 12/27/16  1155     History   Chief Complaint Chief Complaint  Patient presents with  . Depression  . Suicidal  . Hypertension  . Headache    HPI Barbara Ellis is a 46 y.o. female who Presents emergency Department with chief complaint of depression and suicidal ideation. She is a past medical history of hypertension, bipolar disorder with previous psychiatric hospitalization. Patient states that she has been out of her psych medicines since September of this past year because [facility]. She stopped taking her lisinopril back in September as well because it gave her angioedema. She has currently only taking clonidine and gabapentin. However, Sunday, she decided to stop taking those medications because she feels hopeless, that anything would get better and she would "rather be dead anyway." She has been taking on overdosing on her medications. She states that her depression has caused her to lose her job, her home. She is currently living in her car. Her grandmother just died and she is emotionally distressed.. She denies homicidal ideation audiovisual hallucinations or alcohol or drug abuse. She complains of a headache which she states she normally gets "when my blood pressure is high." She describes it as throbbing, right-sided, without visual disturbances, nausea, vomiting, photophobia or phonophobia. She denies neck stiffness or fevers.  HPI  Past Medical History:  Diagnosis Date  . Bipolar 1 disorder ([place])   . Hypertension     Patient Active Problem List   Diagnosis Date Noted  . Bipolar I disorder, most recent episode depressed ([place]) 11/15/2015    Past Surgical History:  Procedure Laterality Date  . c section    . CESAREAN SECTION    . TUBAL LIGATION    . TUBAL LIGATION  1995    OB History    Gravida Para Term Preterm AB  Living   2 2 1 1   2    SAB TAB Ectopic Multiple Live Births                   Home Medications    Prior to Admission medications   Medication Sig Start Date End Date Taking? Authorizing Provider  cloNIDine (CATAPRES) 0.1 MG tablet Take 1 tablet (0.1 mg total) by mouth 2 (two) times daily. 11/22/15  Yes Derrill Center, NP  gabapentin (NEURONTIN) 100 MG capsule Take 1 capsule (100 mg total) by mouth 3 (three) times daily. 11/22/15  Yes Derrill Center, NP    Family History No family history on file.  Social History Social History  Substance Use Topics  . Smoking status: Current Every Day Smoker    Packs/day: 1.00    Types: Cigarettes  . Smokeless tobacco: Never Used  . Alcohol use No     Allergies   Lisinopril and Sulfa antibiotics   Review of Systems Review of Systems Ten systems reviewed and are negative for acute change, except as noted in the HPI.   Physical Exam Updated Vital Signs BP 148/89 (BP Location: Right Arm)   Pulse 62   Temp 99.1 F (37.3 C) (Oral)   Resp 20   Ht 5\' 9"  (1.753 m)   Wt 104.3 kg   SpO2 97%   BMI 33.97 kg/m   Physical Exam  Constitutional: She is oriented to person, place, and time. She appears well-developed and well-nourished. No distress.  Tearful  HENT:  Head: Normocephalic and atraumatic.  Right Ear: External ear normal.  Left Ear: External ear normal.  Mouth/Throat: Oropharynx is clear and moist. No oropharyngeal exudate.  Eyes: Conjunctivae and EOM are normal. Pupils are equal, round, and reactive to light. No scleral icterus.  No horizontal, vertical or rotational nystagmus  Neck: Normal range of motion. Neck supple. No JVD present. No thyromegaly present.  Full active and passive ROM without pain No midline or paraspinal tenderness No nuchal rigidity or meningeal signs  Cardiovascular: Normal rate, regular rhythm, normal heart sounds and intact distal pulses.  Exam reveals no gallop and no friction rub.   No murmur  heard. Pulmonary/Chest: Effort normal and breath sounds normal. No respiratory distress. She has no wheezes. She has no rales.  Abdominal: Soft. Bowel sounds are normal. She exhibits no distension and no mass. There is no tenderness. There is no rebound and no guarding.  Musculoskeletal: Normal range of motion. She exhibits no edema or tenderness.  No meningismus  Lymphadenopathy:    She has no cervical adenopathy.  Neurological: She is alert and oriented to person, place, and time. She has normal reflexes. No cranial nerve deficit. She exhibits normal muscle tone. Coordination normal.  Mental Status:  Alert, oriented, thought content appropriate. Speech fluent without evidence of aphasia. Able to follow 2 step commands without difficulty.  Cranial Nerves:  II:  Peripheral visual fields grossly normal, pupils equal, round, reactive to light III,IV, VI: ptosis not present, extra-ocular motions intact bilaterally  V,VII: smile symmetric, facial light touch sensation equal VIII: hearing grossly normal bilaterally  IX,X: midline uvula rise  XI: bilateral shoulder shrug equal and strong XII: midline tongue extension  Motor:  5/5 in upper and lower extremities bilaterally including strong and equal grip strength and dorsiflexion/plantar flexion Sensory: Pinprick and light touch normal in all extremities.  Cerebellar: normal finger-to-nose with bilateral upper extremities Gait: normal gait and balance CV: distal pulses palpable throughout   Skin: Skin is warm and dry. No rash noted. She is not diaphoretic.  Psychiatric: Judgment normal. She is withdrawn. She exhibits a depressed mood. She expresses suicidal ideation.  Nursing note and vitals reviewed.    ED Treatments / Results  Labs (all labs ordered are listed, but only abnormal results are displayed) Labs Reviewed  COMPREHENSIVE METABOLIC PANEL - Abnormal; Notable for the following:       Result Value   CO2 21 (*)    AST 14 (*)     All other components within normal limits  ACETAMINOPHEN LEVEL - Abnormal; Notable for the following:    Acetaminophen (Tylenol), Serum <10 (*)    All other components within normal limits  CBC - Abnormal; Notable for the following:    WBC 14.2 (*)    Hemoglobin 15.4 (*)    All other components within normal limits  RAPID URINE DRUG SCREEN, HOSP PERFORMED - Abnormal; Notable for the following:    Tetrahydrocannabinol POSITIVE (*)    All other components within normal limits  ETHANOL  SALICYLATE LEVEL    EKG  EKG Interpretation None       Radiology No results found.  Procedures Procedures (including critical care time)  Medications Ordered in ED Medications  cloNIDine (CATAPRES) tablet 0.1 mg (0.1 mg Oral Given 12/27/16 1358)  gabapentin (NEURONTIN) capsule 100 mg (100 mg Oral Given 12/27/16 1658)  prochlorperazine (COMPAZINE) injection 5 mg (0 mg Intravenous Hold 12/27/16 1325)  alum & mag hydroxide-simeth (MAALOX/MYLANTA) 200-200-20 MG/5ML suspension 30 mL (not administered)  ondansetron (ZOFRAN) tablet 4 mg (not administered)  zolpidem (AMBIEN) tablet 5 mg (not administered)  ibuprofen (ADVIL,MOTRIN) tablet 600 mg (600 mg Oral Given 12/27/16 1655)  acetaminophen (TYLENOL) tablet 650 mg (not administered)  sodium chloride 0.9 % bolus 1,000 mL (0 mLs Intravenous Stopped 12/27/16 1600)  thiamine (B-1) injection 100 mg (100 mg Intravenous Given 12/27/16 1402)  diphenhydrAMINE (BENADRYL) injection 12.5 mg (12.5 mg Intravenous Given 12/27/16 1403)  dexamethasone (DECADRON) injection 10 mg (10 mg Intravenous Given 12/27/16 1403)  aspirin-acetaminophen-caffeine (EXCEDRIN MIGRAINE) per tablet 2 tablet (2 tablets Oral Given 12/27/16 2014)     Initial Impression / Assessment and Plan / ED Course  I have reviewed the triage vital signs and the nursing notes.  Pertinent labs & imaging results that were available during my care of the patient were reviewed by me and considered in my  medical decision making (see chart for details).    Vitals:   12/27/16 1210 12/27/16 1457 12/27/16 1845 12/27/16 1930  BP:  138/73 143/67 148/89  Pulse:  83 81 62  Resp:  18 18 20   Temp:  98.9 F (37.2 C) 99.1 F (37.3 C)   TempSrc:  Oral Oral   SpO2:  98% 100% 97%  Weight: 104.3 kg     Height: 5\' 9"  (1.753 m)       Headache, treated and improved, no concern for subarachnoid hemorrhage, meningitis, arteritis. Blood pressures improved with pain control and clonidine. I suspect rebound hypertension. Patient appears medically clear.   Final Clinical Impressions(s) / ED Diagnoses   Final diagnoses:  Depression, unspecified depression type  Hypertension, unspecified type  Bad headache    New Prescriptions New Prescriptions   No medications on file     Margarita Mail, PA-C 12/27/16 2138    Dorie Rank, MD 12/28/16 [phone]
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
## 2016-12-27 NOTE — ED Triage Notes (Addendum)
Patient c/o feeling suicidal with a plan of "taking pills." Patient denies auditory or visual hallucinations. Patient denies any drug or alcohol use today. Patient states she last smoked  marijuana 3 days ago. Patient states she ran out of her psych meds at the end of January. Patient states she stopped taking Lisinopril because she was having facial swelling. Patient states she has not taken BP meds since September/2017

## 2016-12-28 DIAGNOSIS — F17210 Nicotine dependence, cigarettes, uncomplicated: Secondary | ICD-10-CM | POA: Diagnosis not present

## 2016-12-28 DIAGNOSIS — F1290 Cannabis use, unspecified, uncomplicated: Secondary | ICD-10-CM

## 2016-12-28 DIAGNOSIS — F3130 Bipolar disorder, current episode depressed, mild or moderate severity, unspecified: Secondary | ICD-10-CM

## 2016-12-28 DIAGNOSIS — Z79899 Other long term (current) drug therapy: Secondary | ICD-10-CM

## 2016-12-28 MED ORDER — GABAPENTIN 100 MG PO CAPS: 100.0000 mg | ORAL_CAPSULE | Freq: Three times a day (TID) | ORAL | 0 refills | Status: AC

## 2016-12-28 NOTE — ED Notes (Signed)
This nurse and [facility] at pt bedside.

## 2016-12-28 NOTE — ED Notes (Signed)
Donzetta Sprung NP and MD made aware of pt behavior prior to discharge. Pt d/c home per MD order. Pt uninterested when this nurse attempted to review discharge summary. RX provided, bus pass provided. Pt signed for personal property and property returned. Pt signed e-signature. Ambulatory off unit with [facility].

## 2016-12-28 NOTE — ED Notes (Signed)
This nurse in pt room to review discharge summary. Pt uninterested, irritable. When this nurse proceeded to review homeless shelter resources and provide pt with bus pass, pt became irate, reports to this nurse that she drove herself to [place], her car is there and that she needs a ride to her car, not a bus pass. Pt requesting to speak to someone in charge. This nurse notified house [facility].

## 2016-12-28 NOTE — BHH Suicide Risk Assessment (Signed)
Suicide Risk Assessment  Discharge Assessment   [facility] Discharge Suicide Risk Assessment   Principal Problem: Bipolar I disorder, most recent episode depressed [facility]) Discharge Diagnoses:  Patient Active Problem List   Diagnosis Date Noted  . Bipolar I disorder, most recent episode depressed ([place]) [F31.30] 11/15/2015    Priority: High    Total Time spent with patient: 45 minutes  Musculoskeletal: Strength & Muscle Tone: within normal limits Gait & Station: normal Patient leans: N/A  Psychiatric Specialty Exam: Physical Exam  Constitutional: She is oriented to person, place, and time. She appears well-developed and well-nourished.  HENT:  Head: Normocephalic.  Neck: Normal range of motion.  Respiratory: Effort normal.  Musculoskeletal: Normal range of motion.  Neurological: She is alert and oriented to person, place, and time.  Psychiatric: Her speech is normal and behavior is normal. Judgment and thought content normal. Cognition and memory are normal. She exhibits a depressed mood.    Review of Systems  Psychiatric/Behavioral: Positive for depression.  All other systems reviewed and are negative.   Blood pressure 138/70, pulse 75, temperature 98.4 F (36.9 C), temperature source Oral, resp. rate 16, height 5\' 9"  (1.753 m), weight 104.3 kg (230 lb), SpO2 100 %.Body mass index is 33.97 kg/m.  General Appearance: Casual  Eye Contact:  Good  Speech:  Normal Rate  Volume:  Normal  Mood:  Depressed, mild  Affect:  Congruent  Thought Process:  Coherent and Descriptions of Associations: Intact  Orientation:  Full (Time, Place, and Person)  Thought Content:  WDL and Logical  Suicidal Thoughts:  No  Homicidal Thoughts:  No  Memory:  Immediate;   Good Recent;   Good Remote;   Good  Judgement:  Fair  Insight:  Fair  Psychomotor Activity:  Normal  Concentration:  Concentration: Good and Attention Span: Good  Recall:  Good  Fund of Knowledge:  Good  Language:  Good   Akathisia:  No  Handed:  Right  AIMS (if indicated):     Assets:  Leisure Time Physical Health Resilience  ADL's:  Intact  Cognition:  WNL  Sleep:       Mental Status Per Nursing Assessment::   On Admission:   passive suicidal ideations  Demographic Factors:  NA  Loss Factors: Legal issues  Historical Factors: NA  Risk Reduction Factors:   Positive therapeutic relationship  Continued Clinical Symptoms:  Depression, mild  Cognitive Features That Contribute To Risk:  None    Suicide Risk:  Minimal: No identifiable suicidal ideation.  Patients presenting with no risk factors but with morbid ruminations; may be classified as minimal risk based on the severity of the depressive symptoms    Plan Of Care/Follow-up recommendations:  Activity:  as tolerated Diet:  heart healthy diet  Dauna Ziska, NP 12/28/2016, 10:24 AM

## 2016-12-28 NOTE — Progress Notes (Signed)
CSW spoke with patient regarding discharge plans. CSW attempted to provide patient with shelter resources however stated " I dont need those I have my car!". Patient stated her car is at [facility] and police brought her here. CSW stated patient could be provided with bus pass to get back to [facility]/ car. Patient stated someone needed to "drive" her to her car. CSW left resources and bus pass with RN.   Kingsley Spittle, LCSWA Clinical Social Worker [phone]

## 2016-12-28 NOTE — BH Assessment (Signed)
BHH Assessment Progress Note   Per Mojeed Akintayo, MD, this pt does not require psychiatric hospitalization at this time.  Pt is to be discharged from [facility] with recommendation to follow up with [facility].  This has been included in pt's discharge instructions.  Pt's nurse, Ashley, has been notified.  Anjana Cheek, MA Triage Specialist [phone]      

## 2016-12-28 NOTE — Consult Note (Signed)
[facility] Consult   Reason for Consult:  Depression with homelessness Referring Physician:  EDP Patient Identification: Barbara Ellis MRN:  [id number] Principal Diagnosis: Bipolar I disorder, most recent episode depressed ([place]) Diagnosis:   Patient Active Problem List   Diagnosis Date Noted  . Bipolar I disorder, most recent episode depressed ([place]) [F31.30] 11/15/2015    Priority: High    Total Time spent with patient: 45 minutes  Subjective:   Barbara Ellis is a 46 y.o. female patient does not warrant admission.  HPI:  46 yo female who presented to the ED with vague, passive suicidal ideations.  On assessment today, she denies suicidal ideations and attempts.  No homicidal ideations, hallucinations, and substance abuse, except marijuana.  Recently discharged from [facility] and [facility] is full.  Requested homeless resources.  Stable for discharge.  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  : No Does patient have [facility]  services? : No Does patient have P4CC services?: No  Past Medical History:  Past Medical History:  Diagnosis Date  . Bipolar 1 disorder ([place])   . Hypertension     Past Surgical History:  Procedure Laterality Date  . c section    . CESAREAN SECTION    . TUBAL LIGATION    . TUBAL LIGATION  1995   Family History: No family history on file. Family Psychiatric  History: none Social History:  History  Alcohol Use No     History  Drug Use  . Types: Marijuana    Comment: 2-3 times a week.    Social History   Social History  . Marital status: Single    Spouse name: N/A  . Number of children: N/A  . Years of education: N/A   Social History Main Topics  . Smoking status: Current Every Day Smoker    Packs/day: 1.00    Types: Cigarettes  . Smokeless tobacco: Never Used  . Alcohol use No  . Drug use: Yes    Types: Marijuana     Comment: 2-3 times a week.  Marland Kitchen Sexual activity: Yes    Birth control/ protection: Surgical     Comment: last had intercourse 03/13/16   Other Topics Concern  . None   Social History Narrative  . None   Additional Social History:    Allergies:   Allergies  Allergen Reactions  . Lisinopril Swelling and Other (See Comments)    Reaction:  Facial/lip swelling   . Sulfa Antibiotics Hives    Labs:  Results for orders placed or performed during the hospital encounter of 12/27/16 (from the past 48 hour(s))  Rapid urine drug screen (hospital performed)     Status: Abnormal   Collection Time: 12/27/16 12:18 PM  Result Value  Ref Range   Opiates NONE DETECTED NONE DETECTED   Cocaine NONE DETECTED NONE DETECTED   Benzodiazepines NONE DETECTED NONE DETECTED   Amphetamines NONE DETECTED NONE DETECTED   Tetrahydrocannabinol POSITIVE (A) NONE DETECTED   Barbiturates NONE DETECTED NONE DETECTED    Comment:        DRUG SCREEN FOR MEDICAL PURPOSES ONLY.  IF CONFIRMATION IS NEEDED FOR ANY PURPOSE, NOTIFY LAB WITHIN 5 DAYS.        LOWEST DETECTABLE  LIMITS FOR URINE DRUG SCREEN Drug Class       Cutoff (ng/mL) Amphetamine      1000 Barbiturate      200 Benzodiazepine   830 Tricyclics       940 Opiates          300 Cocaine          300 THC              50   Comprehensive metabolic panel     Status: Abnormal   Collection Time: 12/27/16 12:35 PM  Result Value Ref Range   Sodium 138 135 - 145 mmol/L   Potassium 3.5 3.5 - 5.1 mmol/L   Chloride 108 101 - 111 mmol/L   CO2 21 (L) 22 - 32 mmol/L   Glucose, Bld 92 65 - 99 mg/dL   BUN 7 6 - 20 mg/dL   Creatinine, Ser 0.74 0.44 - 1.00 mg/dL   Calcium 8.9 8.9 - 10.3 mg/dL   Total Protein 7.3 6.5 - 8.1 g/dL   Albumin 4.0 3.5 - 5.0 g/dL   AST 14 (L) 15 - 41 U/L   ALT 14 14 - 54 U/L   Alkaline Phosphatase 55 38 - 126 U/L   Total Bilirubin 0.6 0.3 - 1.2 mg/dL   GFR calc non Af Amer >60 >60 mL/min   GFR calc Af Amer >60 >60 mL/min    Comment: (NOTE) The eGFR has been calculated using the CKD EPI equation. This calculation has not been validated in all clinical situations. eGFR's persistently <60 mL/min signify possible Chronic Kidney Disease.    Anion gap 9 5 - 15  cbc     Status: Abnormal   Collection Time: 12/27/16 12:35 PM  Result Value Ref Range   WBC 14.2 (H) 4.0 - 10.5 K/uL   RBC 4.76 3.87 - 5.11 MIL/uL   Hemoglobin 15.4 (H) 12.0 - 15.0 g/dL   HCT 43.7 36.0 - 46.0 %   MCV 91.8 78.0 - 100.0 fL   MCH 32.4 26.0 - 34.0 pg   MCHC 35.2 30.0 - 36.0 g/dL   RDW 13.0 11.5 - 15.5 %   Platelets 304 150 - 400 K/uL  Ethanol     Status: None   Collection Time: 12/27/16 12:38 PM  Result Value Ref Range   Alcohol, Ethyl (B) <5 <5 mg/dL    Comment:        LOWEST DETECTABLE LIMIT FOR SERUM ALCOHOL IS 5 mg/dL FOR MEDICAL PURPOSES ONLY   Salicylate level     Status: None   Collection Time: 12/27/16 12:38 PM  Result Value Ref Range   Salicylate Lvl <7.6 2.8 - 30.0 mg/dL  Acetaminophen level     Status: Abnormal   Collection Time: 12/27/16 12:38 PM  Result Value Ref Range    Acetaminophen (Tylenol), Serum <10 (L) 10 - 30 ug/mL    Comment:        THERAPEUTIC CONCENTRATIONS VARY SIGNIFICANTLY. A RANGE OF 10-30 ug/mL MAY BE AN EFFECTIVE  CONCENTRATION FOR MANY PATIENTS. HOWEVER, SOME ARE BEST TREATED AT CONCENTRATIONS OUTSIDE THIS RANGE. ACETAMINOPHEN CONCENTRATIONS >150 ug/mL AT 4 HOURS AFTER INGESTION AND >50 ug/mL AT 12 HOURS AFTER INGESTION ARE OFTEN ASSOCIATED WITH TOXIC REACTIONS.     Current Facility-Administered Medications  Medication Dose Route Frequency Provider Last Rate Last Dose  . acetaminophen (TYLENOL) tablet 650 mg  650 mg Oral Q4H PRN Margarita Mail, PA-C      . alum & mag hydroxide-simeth (MAALOX/MYLANTA) 200-200-20 MG/5ML suspension 30 mL  30 mL Oral PRN Margarita Mail, PA-C      . cloNIDine (CATAPRES) tablet 0.1 mg  0.1 mg Oral BID Margarita Mail, PA-C   0.1 mg at 12/28/16 0916  . gabapentin (NEURONTIN) capsule 100 mg  100 mg Oral TID Margarita Mail, PA-C   100 mg at 12/28/16 0916  . ibuprofen (ADVIL,MOTRIN) tablet 600 mg  600 mg Oral Q8H PRN Margarita Mail, PA-C   600 mg at 12/27/16 1655  . ondansetron (ZOFRAN) tablet 4 mg  4 mg Oral Q8H PRN Margarita Mail, PA-C      . prochlorperazine (COMPAZINE) injection 5 mg  5 mg Intravenous Once Margarita Mail, PA-C   Stopped at 12/27/16 1325  . zolpidem (AMBIEN) tablet 5 mg  5 mg Oral QHS PRN Margarita Mail, PA-C       Current Outpatient Prescriptions  Medication Sig Dispense Refill  . cloNIDine (CATAPRES) 0.1 MG tablet Take 1 tablet (0.1 mg total) by mouth 2 (two) times daily. 60 tablet 11  . gabapentin (NEURONTIN) 100 MG capsule Take 1 capsule (100 mg total) by mouth 3 (three) times daily. 60 capsule 0    Musculoskeletal: Strength & Muscle Tone: within normal limits Gait & Station: normal Patient leans: N/A  Psychiatric Specialty Exam: Physical Exam  Constitutional: She is oriented to person, place, and time. She appears well-developed and well-nourished.  HENT:  Head:  Normocephalic.  Neck: Normal range of motion.  Respiratory: Effort normal.  Musculoskeletal: Normal range of motion.  Neurological: She is alert and oriented to person, place, and time.  Psychiatric: Her speech is normal and behavior is normal. Judgment and thought content normal. Cognition and memory are normal. She exhibits a depressed mood.    Review of Systems  Psychiatric/Behavioral: Positive for depression.  All other systems reviewed and are negative.   Blood pressure 138/70, pulse 75, temperature 98.4 F (36.9 C), temperature source Oral, resp. rate 16, height '5\' 9"'  (1.753 m), weight 104.3 kg (230 lb), SpO2 100 %.Body mass index is 33.97 kg/m.  General Appearance: Casual  Eye Contact:  Good  Speech:  Normal Rate  Volume:  Normal  Mood:  Depressed, mild  Affect:  Congruent  Thought Process:  Coherent and Descriptions of Associations: Intact  Orientation:  Full (Time, Place, and Person)  Thought Content:  WDL and Logical  Suicidal Thoughts:  No  Homicidal Thoughts:  No  Memory:  Immediate;   Good Recent;   Good Remote;   Good  Judgement:  Fair  Insight:  Fair  Psychomotor Activity:  Normal  Concentration:  Concentration: Good and Attention Span: Good  Recall:  Good  Fund of Knowledge:  Good  Language:  Good  Akathisia:  No  Handed:  Right  AIMS (if indicated):     Assets:  Leisure Time Physical Health Resilience  ADL's:  Intact  Cognition:  WNL  Sleep:        Treatment Plan Summary: Daily contact with patient to assess and evaluate symptoms and progress  in treatment, Medication management and Plan bipolar affective disorder, depressed, mild:  -Crisis stabilization -Medication management:  Continued medical medications and started gabapentin 100 mg TID for mood stabilization -Individual counseling  Disposition: No evidence of imminent risk to self or others at present.    Waylan Boga, NP 12/28/2016 10:15 AM  Patient seen face-to-face for psychiatric  evaluation, chart reviewed and case discussed with the physician extender and developed treatment plan. Reviewed the information documented and agree with the treatment plan. Corena Pilgrim, MD

## 2016-12-28 NOTE — Discharge Instructions (Signed)
For your ongoing mental health needs, you are advised to follow up with [facility].  New and returning patients are seen at their walk-in clinic.  Walk-in hours are Monday - Friday from 8:00 am - 3:00 pm.  Walk-in patients are seen on a first come, first served basis.  Try to arrive as early as possible for he best chance of being seen the same day: ° °     [facility] °     [address] °     [place], [place] [address] °     [phone] °

## 2016-12-28 NOTE — ED Notes (Signed)
Pt noted she would like inpt tx resulted to the number of losses she has experienced within six months: loss of employment, resident, separation of boyfriend (six yrs), and grandmother passed last week. Denies AVH. Staff will continue to monitor and maintain safety.

## 2017-03-19 IMAGING — MR MR HEAD WO/W CM
10 of 13 series · 33 of 48 positions shown · IV contrast (multihance)
Comparison: 09/01/2011

CLINICAL DATA: Severe headaches not responding to traditional
medication. Family history of brain tumor.

EXAM:
MRI HEAD WITHOUT AND WITH CONTRAST
TECHNIQUE: Multiplanar, multiecho pulse sequences of the brain and surrounding
structures were obtained without and with intravenous contrast.
CONTRAST:  20mL MULTIHANCE GADOBENATE DIMEGLUMINE 529 MG/ML IV SOLN

[Series 3: T1 · sagittal · 5.0mm · 0.47mm/px · 2 of 24 slices shown]
[im 1/24]
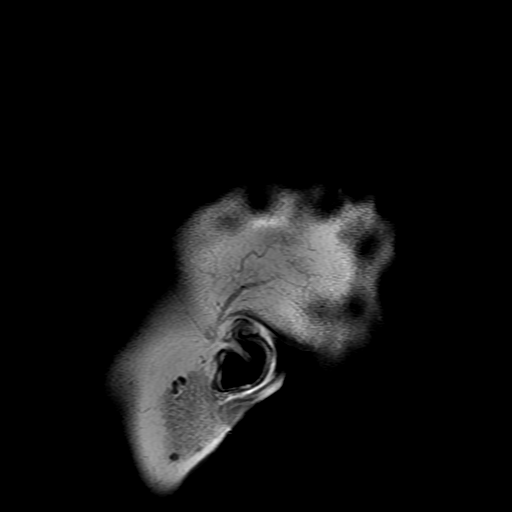
[im 12/24]
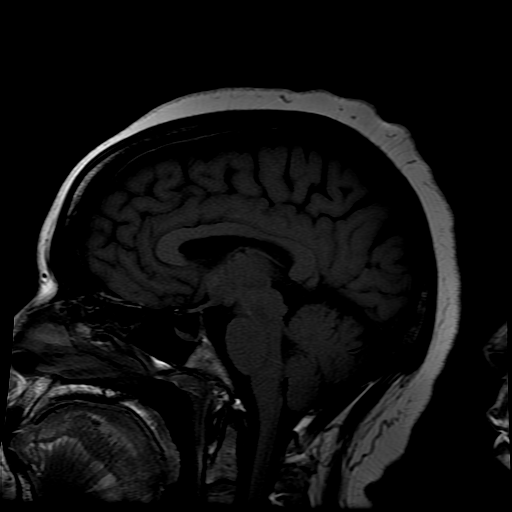

[Series 4: DWI · axial · 3.0mm · 1.09mm/px · z∈[-97,+43]mm · 8 of 98 slices shown (1 of 4)]
[im 1/98]
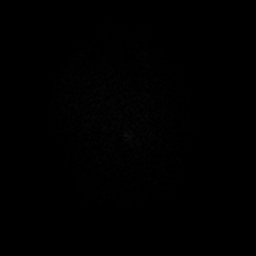
[im 11/98]
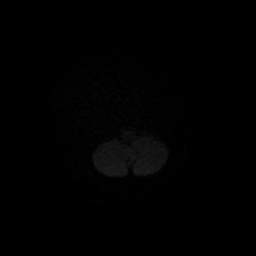
[im 33/98]
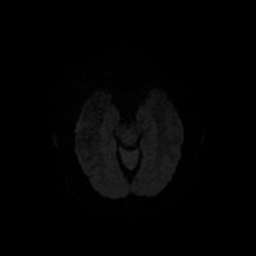
[im 44/98]
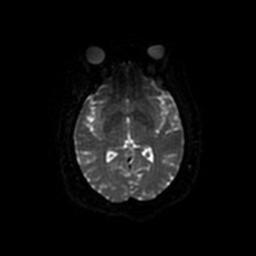
[im 54/98]
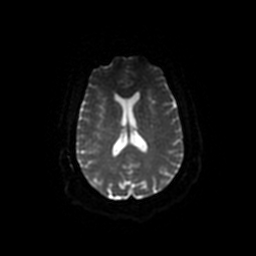
[im 65/98]
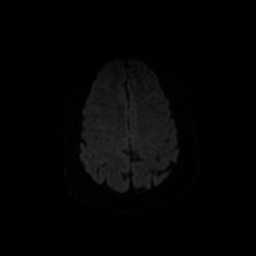
[im 87/98]
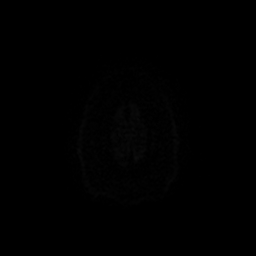
[im 98/98]
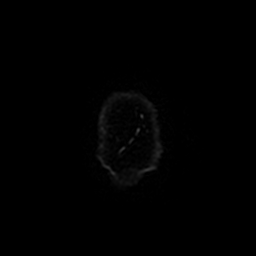

[Series 5: DWI · coronal · 5.0mm · 1.09mm/px · 6 of 72 slices shown (2 of 4)]
[im 1/72]
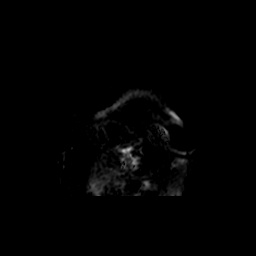
[im 15/72]
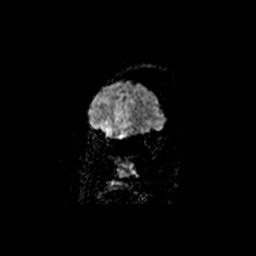
[im 29/72]
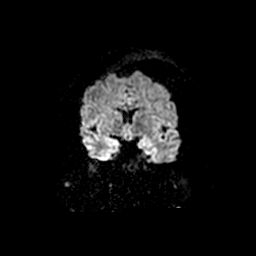
[im 43/72]
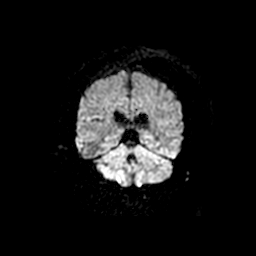
[im 57/72]
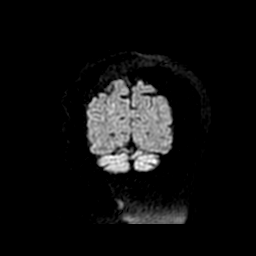
[im 72/72]
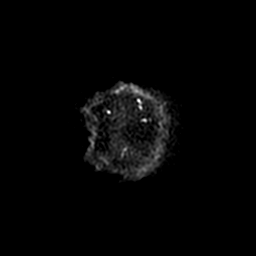

[Series 6: T2 · axial · 5.0mm · 0.43mm/px · z∈[-91,+55]mm · 2 of 24 slices shown]
[im 1/24]
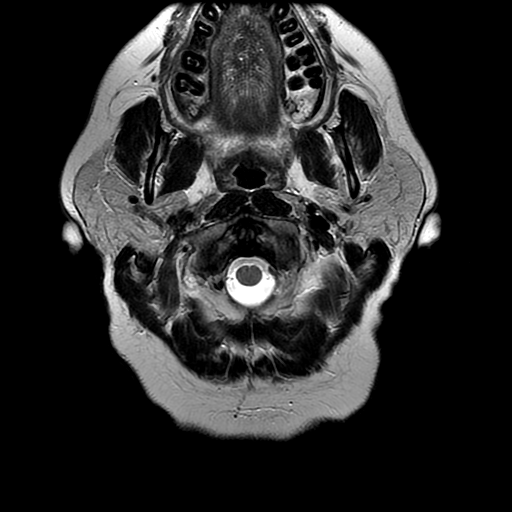
[im 24/24]
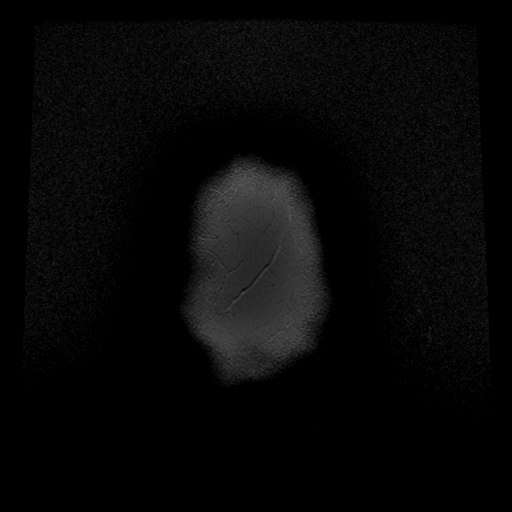

[Series 7: FLAIR · axial · 5.0mm · 0.43mm/px · z∈[-97,+61]mm · 2 of 24 slices shown]
[im 1/24]
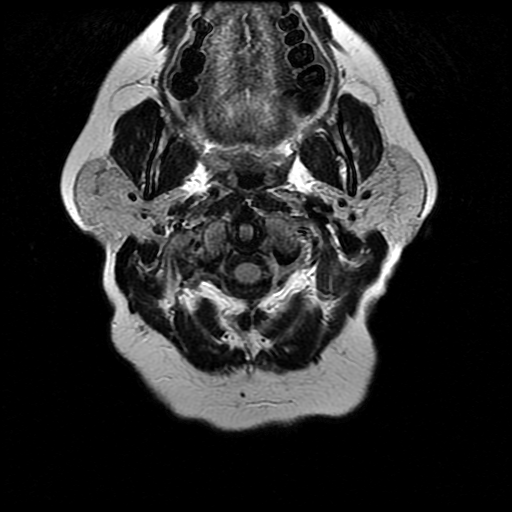
[im 24/24]
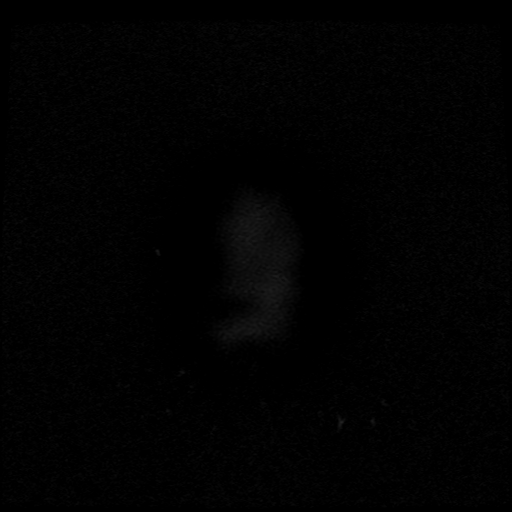

[Series 10: T2 post-contrast · coronal · 5.0mm · 0.45mm/px · 2 of 27 slices shown]
[im 1/27]
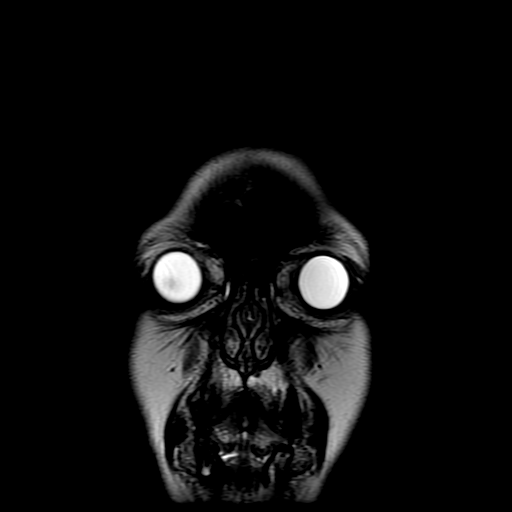
[im 27/27]
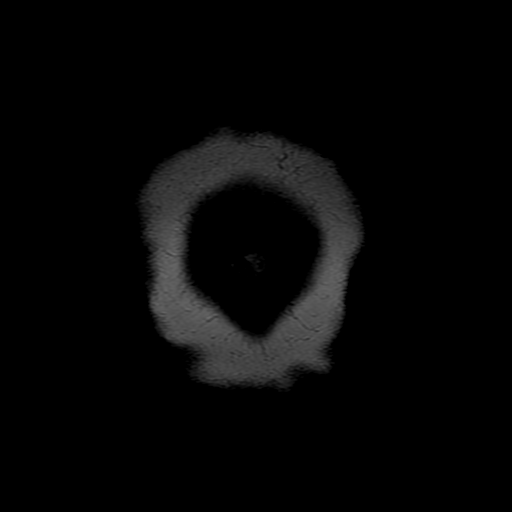

[Series 12: T1 post-contrast · coronal · 5.0mm · 0.45mm/px · 2 of 27 slices shown (1 of 2)]
[im 1/27]
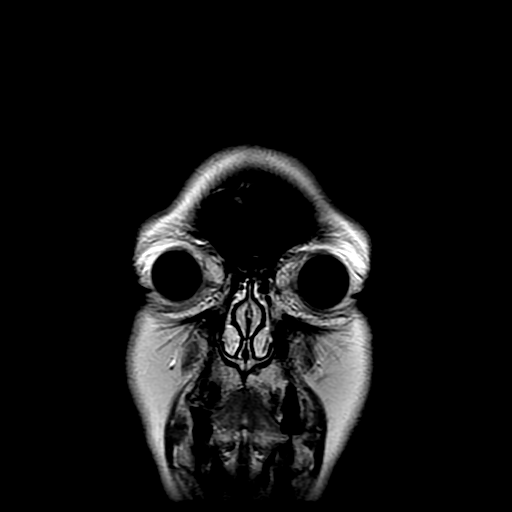
[im 27/27]
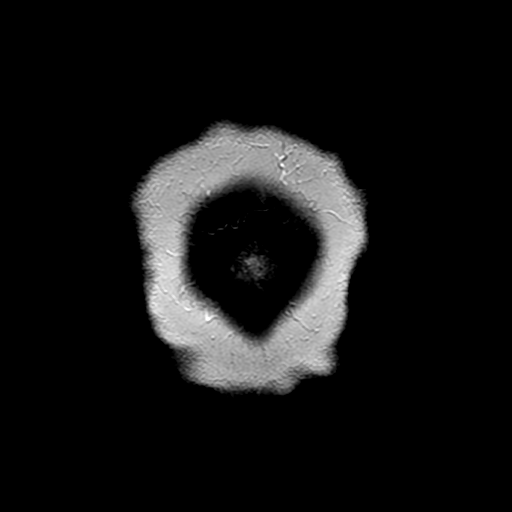

[Series 13: T1 post-contrast · sagittal · 5.0mm · 0.47mm/px · 2 of 24 slices shown (2 of 2)]
[im 1/24]
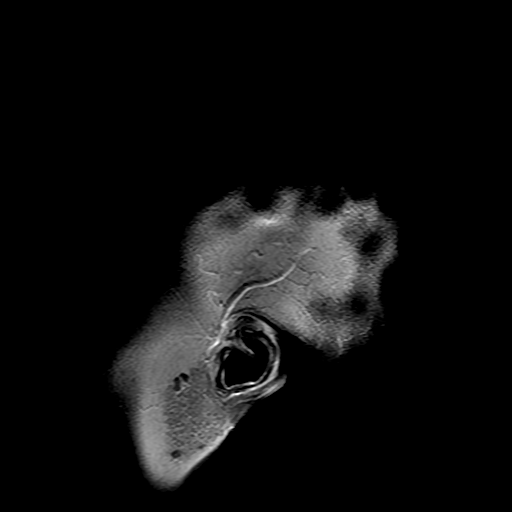
[im 24/24]
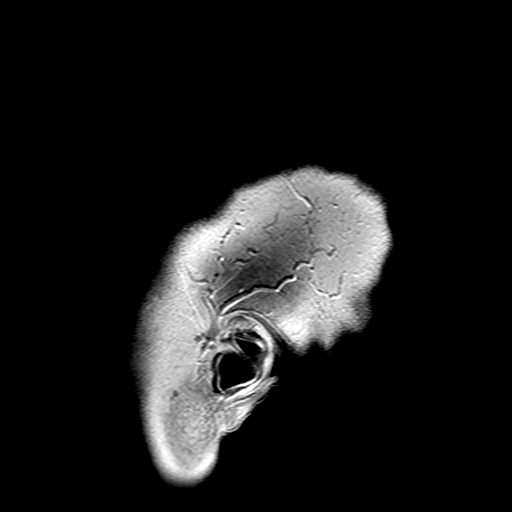

[Series 400: DWI · axial · 3.0mm · 1.09mm/px · z∈[-97,+43]mm · 4 of 49 slices shown (3 of 4)]
[im 1/49]
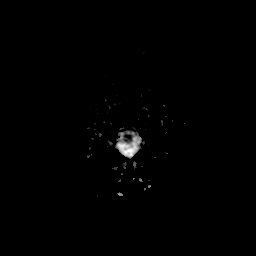
[im 17/49]
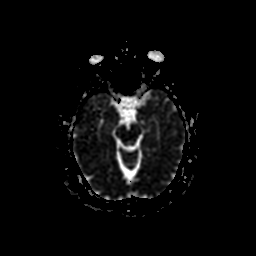
[im 33/49]
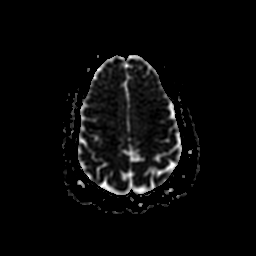
[im 49/49]
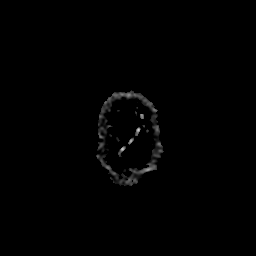

[Series 500: DWI · coronal · 5.0mm · 1.09mm/px · 3 of 36 slices shown (4 of 4)]
[im 1/36]
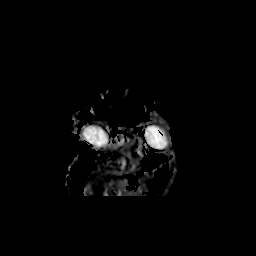
[im 18/36]
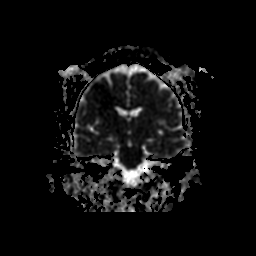
[im 36/36]
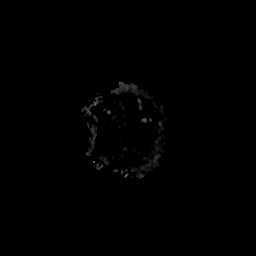

[33 of 48 positions shown; findings below may reference images not displayed]

FINDINGS: A mildly expanded partially empty sella is unchanged. Trace
cerebellar tonsillar ectopia is unchanged. There is no evidence of
acute infarct, intracranial hemorrhage, mass, midline shift, or
extra-axial fluid collection. Ventricles and sulci are normal.
Scattered, punctate foci of T2 hyperintensity in the subcortical
cerebral white matter bilaterally have mildly increased in number
from the prior MRI and are mildly advanced for age. No abnormal
enhancement is identified.

The focus of susceptibility artifact along the posterior right
aspect of the tentorium on the prior MRI is no longer seen. No
definite vascular malformation is identified in this area on the
current examination.

Orbits are unremarkable. Paranasal sinuses and mastoid air cells are
clear. Major intracranial vascular flow voids are preserved.
IMPRESSION: 1. No acute intracranial abnormality or mass.
2. Mild cerebral white matter disease, mildly progressed from 2562
and nonspecific. Considerations include chronic small vessel
ischemia, sequelae of trauma, hypercoagulable state, vasculitis,
migraines, prior infection or less likely demyelination.
3. Unchanged partially empty sella. While this is often an
incidental finding, it can be seen in the setting of intracranial
hypertension.

## 2019-03-26 ENCOUNTER — Encounter: Payer: Self-pay | Admitting: Sports Medicine

## 2019-03-26 ENCOUNTER — Ambulatory Visit: Payer: BLUE CROSS/BLUE SHIELD | Admitting: Sports Medicine

## 2019-03-26 ENCOUNTER — Other Ambulatory Visit: Payer: Self-pay

## 2019-03-26 ENCOUNTER — Ambulatory Visit (INDEPENDENT_AMBULATORY_CARE_PROVIDER_SITE_OTHER): Payer: BLUE CROSS/BLUE SHIELD

## 2019-03-26 ENCOUNTER — Telehealth: Payer: Self-pay | Admitting: *Deleted

## 2019-03-26 VITALS — Temp 97.4°F

## 2019-03-26 DIAGNOSIS — M79672 Pain in left foot: Secondary | ICD-10-CM

## 2019-03-26 DIAGNOSIS — M722 Plantar fascial fibromatosis: Secondary | ICD-10-CM | POA: Diagnosis not present

## 2019-03-26 DIAGNOSIS — B351 Tinea unguium: Secondary | ICD-10-CM

## 2019-03-26 DIAGNOSIS — M25871 Other specified joint disorders, right ankle and foot: Secondary | ICD-10-CM

## 2019-03-26 MED ORDER — DICLOFENAC EPOLAMINE 1.3 % TD PTCH: MEDICATED_PATCH | TRANSDERMAL | 0 refills | Status: DC

## 2019-03-26 MED ORDER — DICLOFENAC EPOLAMINE 1.3 % TD PTCH: MEDICATED_PATCH | TRANSDERMAL | 0 refills | Status: AC

## 2019-03-26 NOTE — Progress Notes (Signed)
Subjective: Barbara Ellis is a 48 y.o. female patient seen today in office with complaint of 1. mildly painful thickened and discolored nails especially at bilateral first toenails, right second and fifth and left fifth toenail. Patient is desiring treatment for nail changes; has tried pedicures in the past. Reports that nails are becoming difficult to manage because of the thickness and is concerned about the cracking in the brittleness that has been going on for at least 10 years.  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  Patient also reports that she has 2.  Pain in the left arch states that she noticed a lump in her arch about 4 years ago that seems sometimes swelled and soft while other times it also seems hard states that the pain on average is 5-6 out of 10 worse with excessive standing and walking and has tried changing shoes and cushions with no relief patient is concerned that this may be a lipoma because she has one on her back that has to be excised on June 1 by general surgery and reports a history of her mother having issues with lipomas and cyst.  Patient also reports that there is 3.  A mass at the right lateral ankle that has been there for several years that is nonpainful patient states that initially she thought it was related to swelling and discussed it with her primary care doctor who put her on fluid medication but the area still stays swollen and her primary care doctor recommend that she have it checked by specialist.  Patient denies any redness warmth drainage pulsation or pain to the right soft tissue mass at the ankle.  Patient denies any trauma or injury.  Patient has no other pedal complaints at this time.   Review of Systems  All other systems reviewed and are negative.    Patient Active Problem List   Diagnosis Date Noted   . Bipolar I disorder, most recent episode depressed ([place]) 11/15/2015    Current Outpatient Medications on File Prior to Visit  Medication Sig Dispense Refill  . cloNIDine (CATAPRES) 0.1 MG tablet Take 1 tablet (0.1 mg total) by mouth 2 (two) times daily. 60 tablet 11  . gabapentin (NEURONTIN) 100 MG capsule Take 1 capsule (100 mg total) by mouth 3 (three) times daily. (Patient not taking: Reported on 03/26/2019) 60 capsule 0  . losartan-hydrochlorothiazide (HYZAAR) 50-12.5 MG tablet Take by mouth.     No current facility-administered medications on file prior to visit.     Allergies  Allergen Reactions  . Lisinopril Swelling and Other (See Comments)    Reaction:  Facial/lip swelling   . Sulfa Antibiotics Hives    Objective: Physical Exam  General: Well developed, nourished, no acute distress, awake, alert and oriented x 3  Vascular: Dorsalis pedis artery 2/4 bilateral, Posterior tibial artery 2/4 bilateral, skin temperature warm to warm proximal to distal bilateral lower extremities, no varicosities, pedal hair present bilateral.  Neurological: Gross sensation present via light touch bilateral.   Dermatological: Skin is warm, dry, and supple bilateral, Nails 1-10 are tender, short thick, and discolored with mild subungal debris with bilateral first and fifth toenails most involved with also some involvement at the right second toenail, no webspace macerations present bilateral, no open lesions present bilateral, no callus/corns/hyperkeratotic tissue present  bilateral.  There is significant soft tissue swelling and a large soft tissue mass that measures about 6 cm at the dorsal lateral right foot and ankle that is nonpulsatile however movable probable cyst versus lipoma.  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  No signs of infection bilateral.  Musculoskeletal: Pes planus boney  deformities noted bilateral. Muscular strength within normal limits without painon range of motion. No pain with calf compression bilateral.  X-ray right and left: Right ankle reveal soft tissue swelling but no other acute abnormalities Left foot 3 views no fracture or dislocation there is inferior calcaneal heel spur with thickening of the plantar fascia, midtarsal breech supportive of pes planus, no other acute findings.  Assessment and Plan:  Problem List Items Addressed This Visit    None    Visit Diagnoses    Nail fungus    -  Primary   Bil 1st toes   Mass of joint of right ankle       6cm   Relevant Orders   DG Ankle Complete Right (Completed)   Plantar fascial fibromatosis       Relevant Orders   DG Foot Complete Left (Completed)   Foot arch pain, left       Relevant Orders   DG Foot Complete Left (Completed)      -Examined patient -Discussed with patient treatment options for nonpainful raised soft tissue mass at the right lateral ankle -Ordered MRI for further evaluation of right soft tissue mass to determine its etiology and advised patient that we will closely monitor if this continues to increase in size or is more concerning for a more malignant mass or if pain is associated may require excision; will call patient with MRI results if available prior to her next follow-up appointment -Discussed treatment options for plantar fibroma left arch -Prescribed Flector patch to use during the day to help with dissolving and reducing the inflammation along the left arch advised gentle massage and icing and good supportive shoes -Advised patient that surgery would be the last option for this mass because of the risk of recurrence, painful scar, or nerve issues -Discussed treatment options for painful dystrophic nails bilateral -Fungal culture was obtained by removing a portion of the hard nail itself from each of the involved toenails at bilateral first and fifth toes and right  second toe using a sterile nail nipper and sent to [facility] lab. Patient tolerated the biopsy procedure well without discomfort or need for anesthesia.  -Patient to return in 4 weeks for follow up evaluation and discussion of fungal culture results or sooner if symptoms worsen.  Landis Martins, DPM

## 2019-03-27 NOTE — Telephone Encounter (Signed)
Orders to J. Quintana, RN for pre-cert. 

## 2019-03-27 NOTE — Telephone Encounter (Signed)
-----   Message from Landis Martins, [place] sent at 03/26/2019  4:32 PM EDT ----- Regarding: MRI R ankle Soft tissue mass >6cm not improved with fluid medication by PCP

## 2019-04-24 ENCOUNTER — Ambulatory Visit: Payer: BLUE CROSS/BLUE SHIELD | Admitting: Sports Medicine

## 2019-04-30 ENCOUNTER — Other Ambulatory Visit: Payer: Self-pay | Admitting: *Deleted

## 2019-04-30 DIAGNOSIS — B351 Tinea unguium: Secondary | ICD-10-CM

## 2019-05-22 ENCOUNTER — Ambulatory Visit: Payer: BC Managed Care – PPO | Admitting: Sports Medicine

## 2019-07-16 ENCOUNTER — Other Ambulatory Visit (HOSPITAL_COMMUNITY): Payer: Self-pay | Admitting: *Deleted

## 2019-07-16 DIAGNOSIS — R1310 Dysphagia, unspecified: Secondary | ICD-10-CM

## 2019-07-24 ENCOUNTER — Ambulatory Visit (HOSPITAL_COMMUNITY)
Admission: RE | Admit: 2019-07-24 | Discharge: 2019-07-24 | Disposition: A | Discharge status: Home / Self Care | Payer: BC Managed Care – PPO | Source: Ambulatory Visit | Attending: Otolaryngology | Admitting: Otolaryngology

## 2019-07-24 ENCOUNTER — Other Ambulatory Visit: Payer: Self-pay

## 2019-07-24 ENCOUNTER — Encounter (HOSPITAL_COMMUNITY): Payer: Self-pay

## 2019-07-24 DIAGNOSIS — R1312 Dysphagia, oropharyngeal phase: Secondary | ICD-10-CM | POA: Insufficient documentation

## 2019-07-24 DIAGNOSIS — R1310 Dysphagia, unspecified: Secondary | ICD-10-CM | POA: Diagnosis not present

## 2019-07-24 NOTE — Therapy (Signed)
Modified Barium Swallow Progress Note  Patient Details  Name: Barbara Ellis MRN: [id number] Date of Birth: [birth year]  Today's Date: 07/24/2019  Modified Barium Swallow completed.  Full report located under Chart Review in the Imaging Section.  Brief recommendations include the following:  Clinical Impression Pt presents with functional swallow. Orally, pt exhibits adequate prep and propulsion of thin and nectar thick liquids. She demonstrates holding and difficulty with posterior propulsion of puree and solid textures, appearing to struggle to trigger swallow reflex. Offering liquid to follow solid appeared to result in less oral holding.   Pharyngeally, pt exhibits timely swallow reflex of nectar thick liquids, puree and solid, with trigger noted at the vallecular sinus. Thin liquid trigger is variable - vallecular and pyriform sinus was noted. No penetration, aspiration, or post-swallow residue noted on any consistency tested. There was not visible indication to explain pt difficulty. She reports globus sensation, however, esophageal sweep revealed it cleared effectively. Given history of GERD, pt was given written behavioral and dietary strategies for management of esophageal dysmotility. No further ST intervention was recommended at this time. Please reconsult if needs arise.   Swallow Evaluation Recommendations  SLP Diet Recommendations: Regular solids;Thin liquid   Liquid Administration via: Cup;Straw   Medication Administration: Whole meds with liquid   Supervision: Patient able to self feed   Compensations: Slow rate;Small sips/bites;Follow solids with liquid   Postural Changes: Remain semi-upright after after feeds/meals (Comment);Seated upright at 90 degrees   Oral Care Recommendations: Oral care BID      Celia B. Quentin Ore, [facility], [place] Speech Language Pathologist [phone]  Shonna Chock 07/24/2019,2:36 PM

## 2020-05-14 ENCOUNTER — Telehealth: Payer: Self-pay | Admitting: Orthopaedic Surgery

## 2020-05-14 NOTE — Telephone Encounter (Signed)
Made in error
